# Patient Record
Sex: Male | Born: 1960 | Race: White | Hispanic: No | Marital: Single | Smoking: Never smoker
Health system: Southern US, Community
[De-identification: ages and names within clinical notes are randomized; demographics above are authoritative.]

## PROBLEM LIST (undated history)

## (undated) DIAGNOSIS — K5730 Diverticulosis of large intestine without perforation or abscess without bleeding: Secondary | ICD-10-CM

## (undated) DIAGNOSIS — M5030 Other cervical disc degeneration, unspecified cervical region: Secondary | ICD-10-CM

## (undated) DIAGNOSIS — L409 Psoriasis, unspecified: Secondary | ICD-10-CM

## (undated) DIAGNOSIS — G4733 Obstructive sleep apnea (adult) (pediatric): Secondary | ICD-10-CM

## (undated) DIAGNOSIS — F411 Generalized anxiety disorder: Secondary | ICD-10-CM

## (undated) DIAGNOSIS — N529 Male erectile dysfunction, unspecified: Secondary | ICD-10-CM

## (undated) DIAGNOSIS — E785 Hyperlipidemia, unspecified: Secondary | ICD-10-CM

## (undated) DIAGNOSIS — G20C Parkinsonism, unspecified: Secondary | ICD-10-CM

## (undated) DIAGNOSIS — Z8659 Personal history of other mental and behavioral disorders: Secondary | ICD-10-CM

## (undated) DIAGNOSIS — G3184 Mild cognitive impairment, so stated: Secondary | ICD-10-CM

## (undated) DIAGNOSIS — Z8719 Personal history of other diseases of the digestive system: Secondary | ICD-10-CM

## (undated) DIAGNOSIS — N3281 Overactive bladder: Secondary | ICD-10-CM

## (undated) DIAGNOSIS — G20 Parkinson's disease: Secondary | ICD-10-CM

## (undated) DIAGNOSIS — R2242 Localized swelling, mass and lump, left lower limb: Secondary | ICD-10-CM

## (undated) DIAGNOSIS — Z9989 Dependence on other enabling machines and devices: Secondary | ICD-10-CM

## (undated) HISTORY — DX: Dependence on other enabling machines and devices: Z99.89

## (undated) HISTORY — DX: Parkinson's disease: G20

## (undated) HISTORY — PX: APPENDECTOMY: SHX54

## (undated) HISTORY — DX: Hyperlipidemia, unspecified: E78.5

---
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
## 1997-09-21 ENCOUNTER — Emergency Department (HOSPITAL_COMMUNITY): Admission: EM | Admit: 1997-09-21 | Discharge: 1997-09-21 | Payer: Self-pay

## 1997-09-27 ENCOUNTER — Emergency Department (HOSPITAL_COMMUNITY): Admission: EM | Admit: 1997-09-27 | Discharge: 1997-09-27 | Payer: Self-pay | Admitting: Emergency Medicine

## 2000-02-17 ENCOUNTER — Emergency Department (HOSPITAL_COMMUNITY): Admission: EM | Admit: 2000-02-17 | Discharge: 2000-02-17 | Payer: Self-pay | Admitting: Emergency Medicine

## 2000-08-20 ENCOUNTER — Ambulatory Visit (HOSPITAL_COMMUNITY): Admission: RE | Admit: 2000-08-20 | Discharge: 2000-08-20 | Payer: Self-pay | Admitting: Family Medicine

## 2000-08-20 ENCOUNTER — Encounter: Payer: Self-pay | Admitting: Family Medicine

## 2008-11-14 ENCOUNTER — Emergency Department (HOSPITAL_COMMUNITY): Admission: EM | Admit: 2008-11-14 | Discharge: 2008-11-14 | Payer: Self-pay | Admitting: Emergency Medicine
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
## 2010-07-09 LAB — ETHANOL: Alcohol, Ethyl (B): 186 mg/dL — ABNORMAL HIGH (ref 0–10)

## 2013-08-06 ENCOUNTER — Ambulatory Visit (INDEPENDENT_AMBULATORY_CARE_PROVIDER_SITE_OTHER): Payer: BC Managed Care – PPO | Admitting: General Surgery

## 2013-08-06 ENCOUNTER — Encounter (INDEPENDENT_AMBULATORY_CARE_PROVIDER_SITE_OTHER): Payer: Self-pay | Admitting: General Surgery

## 2013-08-06 VITALS — BP 130/80 | HR 82 | Temp 97.8°F | Resp 16 | Ht 68.0 in | Wt 195.8 lb

## 2013-08-06 DIAGNOSIS — K429 Umbilical hernia without obstruction or gangrene: Secondary | ICD-10-CM

## 2013-08-06 NOTE — Progress Notes (Signed)
Patient ID: Hector AlbertsDaniel Douglas, male   DOB: 13-Jan-1961, 53 y.o.   MRN: [id number]  Chief Complaint  Patient presents with  . New Evaluation    eval umb hernia    HPI Hector Douglas is a 53 y.o. male.  He is referred by Dr. Wynelle LinkSun for evaluation of umbilical pain and possible hernia.  The patient has no prior history of any type of hernia. 2 weeks ago while he was helping up and move furniture he develops a little bit of discomfort and thought he felt a bulge at the upper rim of the umbilicus. He saw his PCP who also that there might be a small bulge. He is asymptomatic now  Comorbidities included sleep apnea, on CPAP, followed by Benjaman KindlerJim Osborne. Psoriasis. Hyperlipidemia he is employed as a [employer] and doesn't do any heavy lifting  HPI  Past Medical History  Diagnosis Date  . Hyperlipidemia     Past Surgical History  Procedure Laterality Date  . Appendectomy      1977    Family History  Problem Relation Age of Onset  . Cancer Mother 4550    breast    Social History History  Substance Use Topics  . Smoking status: Never Smoker   . Smokeless tobacco: Never Used  . Alcohol Use: Yes     Comment: occ    No Known Allergies  Current Outpatient Prescriptions  Medication Sig Dispense Refill  . aspirin 81 MG tablet Take 81 mg by mouth daily.      . cyclobenzaprine (FLEXERIL) 10 MG tablet Take 10 mg by mouth 3 (three) times daily as needed for muscle spasms.      . simvastatin (ZOCOR) 20 MG tablet Take 20 mg by mouth daily.      . traMADol (ULTRAM) 50 MG tablet Take 50 mg by mouth every 12 (twelve) hours as needed.       No current facility-administered medications for this visit.    Review of Systems Review of Systems  Constitutional: Positive for fatigue. Negative for fever, chills and unexpected weight change.  HENT: Negative for congestion, hearing loss, sore throat, trouble swallowing and voice change.   Eyes: Negative for visual disturbance.  Respiratory: Negative for cough and  wheezing.   Cardiovascular: Negative for chest pain, palpitations and leg swelling.  Gastrointestinal: Positive for abdominal pain. Negative for nausea, vomiting, diarrhea, constipation, blood in stool, abdominal distention, anal bleeding and rectal pain.  Genitourinary: Negative for hematuria and difficulty urinating.  Musculoskeletal: Negative for arthralgias.  Skin: Negative for rash and wound.  Neurological: Negative for seizures, syncope, weakness and headaches.  Hematological: Negative for adenopathy. Does not bruise/bleed easily.  Psychiatric/Behavioral: Negative for confusion.    Blood pressure 130/80, pulse 82, temperature 97.8 F (36.6 C), temperature source Temporal, resp. rate 16, height 5\' 8"  (1.727 m), weight 195 lb 12.8 oz (88.814 kg).  Physical Exam Physical Exam  Constitutional: He is oriented to person, place, and time. He appears well-developed and well-nourished. No distress.  Very pleasant. Cooperative. Looks slightly fatigued, possibly related to his chronic sleep apnea.  HENT:  Head: Normocephalic.  Nose: Nose normal.  Mouth/Throat: No oropharyngeal exudate.  Eyes: Conjunctivae and EOM are normal. Pupils are equal, round, and reactive to light. Right eye exhibits no discharge. Left eye exhibits no discharge. No scleral icterus.  Neck: Normal range of motion. Neck supple. No JVD present. No tracheal deviation present. No thyromegaly present.  Cardiovascular: Normal rate, regular rhythm, normal heart sounds and intact distal pulses.  No murmur heard. Pulmonary/Chest: Effort normal and breath sounds normal. No stridor. No respiratory distress. He has no wheezes. He has no rales. He exhibits no tenderness.  Abdominal: Soft. Bowel sounds are normal. He exhibits no distension and no mass. There is no tenderness. There is no rebound and no guarding.  I cannot detect an umbilical or inguinal hernia despite multiple maneuvers supine and standing. There is no tenderness or  mass.  Musculoskeletal: Normal range of motion. He exhibits no edema and no tenderness.  Lymphadenopathy:    He has no cervical adenopathy.  Neurological: He is alert and oriented to person, place, and time. He has normal reflexes. Coordination normal.  Skin: Skin is warm and dry. No rash noted. He is not diaphoretic. No erythema. No pallor.  Psychiatric: He has a normal mood and affect. His behavior is normal. Judgment and thought content normal.    Data Reviewed Office notes from Dr. Wynelle LinkSun  Assessment    Umbilical pain. History is suggestive of a small umbilical hernia which is now asymptomatic and nondetectable on physical exam.  No indication for surgical intervention, unless symptoms or a bulge recur. I told him this might happen     Plan    I discussed the differential diagnosis including muscle strain or small hernia. I discussed operative techniques for repair of hernia that became necessary in the future.  Resume normal activities  Return to see me if he develops recurrent pain or bulge.        [name] 08/06/2013, 9:35 AM

## 2013-08-06 NOTE — Patient Instructions (Signed)
It sounds like you may have a very small umbilical hernia, but this cannot be detected on physical exam today.  Since we cannot feel the hernia and you are not having the pain, I do not advise any surgery or x-rays at this time.  You may resume normal activities.  Return to see Dr. Derrell LollingIngram if the pain returns or an obvious bulge develops.       Umbilical Herniorrhaphy Herniorrhaphy is surgery to repair a hernia. A hernia is the protrusion of a part of an organ through an abdominal opening. An umbilical hernia means that your hernia is in the area around your navel. If the hernia is not repaired, the gap could get bigger. Your intestines or other tissues, such as fat, could get trapped in the gap. This can lead to other health problems, such as blocked intestines. If the hernia is fixed before problems set in, you may be allowed to go home the same day as the surgery (outpatient). LET YOUR CAREGIVER KNOW ABOUT:  Allergies to food or medicine.  Medicines taken, including vitamins, herbs, eyedrops, over-the-counter medicines, and creams.  Use of steroids (by mouth or creams).  Previous problems with anesthetics or numbing medicines.  History of bleeding problems or blood clots.  Previous surgery.  Other health problems, including diabetes and kidney problems.  Possibility of pregnancy, if this applies. RISKS AND COMPLICATIONS  Pain.  Excessive bleeding.  Hematoma. This is a pocket of blood that collects under the surgery site.  Infection at the surgery site.  Numbness at the surgery site.  Swelling and bruising.  Blood clots.  Intestinal damage (rare).  Scarring.  Skin damage.  Development of another hernia. This may require another surgery. BEFORE THE PROCEDURE  Ask your caregiver about changing or stopping your regular medicines. You may need to stop taking aspirin, nonsteroidal anti-inflammatory drugs (NSAIDs), vitamin E, and blood thinners as early as 2 weeks  before the procedure.  Do not eat or drink for 8 hours before the procedure, or as directed by your caregiver.  You might be asked to shower or wash with an antibacterial soap before the procedure.  Wear comfortable clothes that will be easy to put on after the procedure. PROCEDURE You will be given an intravenous (IV) tube. A needle will be inserted in your arm. Medicine will flow directly into your body through this needle. You might be given medicine to help you relax (sedative). You will be given medicine that numbs the area (local anesthetic) or medicine that makes you sleep (general anesthetic). If you have open surgery:  The surgeon will make a cut (incision) in your abdomen.  The gap in the muscle wall will be repaired. The surgeon may sew the edges together over the gap or use a mesh material to strengthen the area. When mesh is used, the body grows new, strong tissue into and around it. This new tissue closes the gap.  A drain might be put in to remove excess fluid from the body after surgery.  The surgeon will close the incision with stitches, glue, or staples. If you have laparoscopic surgery:  The surgeon will make several small incisions in your abdomen.  A thin, lighted tube (laparoscope) will be inserted into the abdomen through an incision. A camera is attached to the laparoscope that allows the surgeon to see inside the abdomen.  Tools will be inserted through the other incisions to repair the hernia. Usually, mesh is used to cover the gap.  The  surgeon will close the incisions with stitches. AFTER THE PROCEDURE  You will be taken to a recovery area. A nurse will watch and check your progress.  When you are awake, feeling well, and taking fluids well, you may be allowed to go home. In some cases, you may need to stay overnight in the hospital.  Arrange for someone to drive you home. Document Released: 06/15/2008 Document Revised: 09/18/2011 Document Reviewed:  06/20/2011 [facility] Patient Information 2014 [employer] [place].

## 2015-01-10 ENCOUNTER — Other Ambulatory Visit: Payer: Self-pay | Admitting: General Surgery

## 2016-04-02 HISTORY — PX: UMBILICAL HERNIA REPAIR: SHX196

## 2018-06-05 DIAGNOSIS — N400 Enlarged prostate without lower urinary tract symptoms: Secondary | ICD-10-CM | POA: Diagnosis not present

## 2018-06-05 DIAGNOSIS — N3281 Overactive bladder: Secondary | ICD-10-CM | POA: Diagnosis not present

## 2019-04-03 HISTORY — PX: CATARACT EXTRACTION W/ INTRAOCULAR LENS IMPLANT: SHX1309

## 2020-06-29 ENCOUNTER — Observation Stay (HOSPITAL_COMMUNITY)
Admission: EM | Admit: 2020-06-29 | Discharge: 2020-07-02 | Disposition: A | Discharge status: Home / Self Care | Payer: 59 | Attending: Internal Medicine | Admitting: Internal Medicine

## 2020-06-29 ENCOUNTER — Other Ambulatory Visit: Payer: Self-pay

## 2020-06-29 DIAGNOSIS — I959 Hypotension, unspecified: Secondary | ICD-10-CM | POA: Diagnosis present

## 2020-06-29 DIAGNOSIS — R55 Syncope and collapse: Secondary | ICD-10-CM

## 2020-06-29 DIAGNOSIS — Z803 Family history of malignant neoplasm of breast: Secondary | ICD-10-CM

## 2020-06-29 DIAGNOSIS — E785 Hyperlipidemia, unspecified: Secondary | ICD-10-CM | POA: Insufficient documentation

## 2020-06-29 DIAGNOSIS — J309 Allergic rhinitis, unspecified: Secondary | ICD-10-CM | POA: Insufficient documentation

## 2020-06-29 DIAGNOSIS — D62 Acute posthemorrhagic anemia: Principal | ICD-10-CM | POA: Insufficient documentation

## 2020-06-29 DIAGNOSIS — K5731 Diverticulosis of large intestine without perforation or abscess with bleeding: Secondary | ICD-10-CM | POA: Diagnosis not present

## 2020-06-29 DIAGNOSIS — N529 Male erectile dysfunction, unspecified: Secondary | ICD-10-CM | POA: Diagnosis present

## 2020-06-29 DIAGNOSIS — K921 Melena: Secondary | ICD-10-CM | POA: Diagnosis present

## 2020-06-29 DIAGNOSIS — G4733 Obstructive sleep apnea (adult) (pediatric): Secondary | ICD-10-CM | POA: Insufficient documentation

## 2020-06-29 DIAGNOSIS — Z7982 Long term (current) use of aspirin: Secondary | ICD-10-CM

## 2020-06-29 DIAGNOSIS — Z20822 Contact with and (suspected) exposure to covid-19: Secondary | ICD-10-CM | POA: Insufficient documentation

## 2020-06-29 DIAGNOSIS — K635 Polyp of colon: Secondary | ICD-10-CM | POA: Insufficient documentation

## 2020-06-29 DIAGNOSIS — R569 Unspecified convulsions: Secondary | ICD-10-CM | POA: Diagnosis present

## 2020-06-29 DIAGNOSIS — Z79899 Other long term (current) drug therapy: Secondary | ICD-10-CM | POA: Diagnosis not present

## 2020-06-29 DIAGNOSIS — E876 Hypokalemia: Secondary | ICD-10-CM | POA: Diagnosis not present

## 2020-06-29 DIAGNOSIS — D649 Anemia, unspecified: Secondary | ICD-10-CM

## 2020-06-29 DIAGNOSIS — D72829 Elevated white blood cell count, unspecified: Secondary | ICD-10-CM | POA: Insufficient documentation

## 2020-06-29 DIAGNOSIS — K621 Rectal polyp: Secondary | ICD-10-CM | POA: Diagnosis not present

## 2020-06-29 DIAGNOSIS — K922 Gastrointestinal hemorrhage, unspecified: Principal | ICD-10-CM

## 2020-06-29 LAB — CBC WITH DIFFERENTIAL/PLATELET
Abs Immature Granulocytes: 0.18 10*3/uL — ABNORMAL HIGH (ref 0.00–0.07)
Basophils Absolute: 0.1 10*3/uL (ref 0.0–0.1)
Basophils Relative: 0 %
Eosinophils Absolute: 0.1 10*3/uL (ref 0.0–0.5)
Eosinophils Relative: 1 %
HCT: 36.1 % — ABNORMAL LOW (ref 39.0–52.0)
Hemoglobin: 11.7 g/dL — ABNORMAL LOW (ref 13.0–17.0)
Immature Granulocytes: 1 %
Lymphocytes Relative: 8 %
Lymphs Abs: 1.6 10*3/uL (ref 0.7–4.0)
MCH: 31.1 pg (ref 26.0–34.0)
MCHC: 32.4 g/dL (ref 30.0–36.0)
MCV: 96 fL (ref 80.0–100.0)
Monocytes Absolute: 1.1 10*3/uL — ABNORMAL HIGH (ref 0.1–1.0)
Monocytes Relative: 6 %
Neutro Abs: 16 10*3/uL — ABNORMAL HIGH (ref 1.7–7.7)
Neutrophils Relative %: 84 %
Platelets: 256 10*3/uL (ref 150–400)
RBC: 3.76 MIL/uL — ABNORMAL LOW (ref 4.22–5.81)
RDW: 13 % (ref 11.5–15.5)
WBC: 19 10*3/uL — ABNORMAL HIGH (ref 4.0–10.5)
nRBC: 0 % (ref 0.0–0.2)

## 2020-06-29 LAB — PROTIME-INR
INR: 1 (ref 0.8–1.2)
Prothrombin Time: 13.1 seconds (ref 11.4–15.2)

## 2020-06-29 LAB — COMPREHENSIVE METABOLIC PANEL
ALT: 26 U/L (ref 0–44)
AST: 20 U/L (ref 15–41)
Albumin: 3.6 g/dL (ref 3.5–5.0)
Alkaline Phosphatase: 52 U/L (ref 38–126)
Anion gap: 7 (ref 5–15)
BUN: 16 mg/dL (ref 6–20)
CO2: 25 mmol/L (ref 22–32)
Calcium: 8.5 mg/dL — ABNORMAL LOW (ref 8.9–10.3)
Chloride: 105 mmol/L (ref 98–111)
Creatinine, Ser: 1.07 mg/dL (ref 0.61–1.24)
GFR, Estimated: >60 mL/min (ref >60)
Glucose, Bld: 139 mg/dL — ABNORMAL HIGH (ref 70–99)
Potassium: 3.7 mmol/L (ref 3.5–5.1)
Sodium: 137 mmol/L (ref 135–145)
Total Bilirubin: 0.6 mg/dL (ref 0.3–1.2)
Total Protein: 5.8 g/dL — ABNORMAL LOW (ref 6.5–8.1)

## 2020-06-29 LAB — I-STAT CHEM 8, ED
BUN: 18 mg/dL (ref 6–20)
Calcium, Ion: 1.16 mmol/L (ref 1.15–1.40)
Chloride: 102 mmol/L (ref 98–111)
Creatinine, Ser: 1 mg/dL (ref 0.61–1.24)
Glucose, Bld: 136 mg/dL — ABNORMAL HIGH (ref 70–99)
HCT: 34 % — ABNORMAL LOW (ref 39.0–52.0)
Hemoglobin: 11.6 g/dL — ABNORMAL LOW (ref 13.0–17.0)
Potassium: 3.6 mmol/L (ref 3.5–5.1)
Sodium: 139 mmol/L (ref 135–145)
TCO2: 25 mmol/L (ref 22–32)

## 2020-06-29 LAB — TYPE AND SCREEN
ABO/RH(D): A POS
Antibody Screen: NEGATIVE

## 2020-06-29 LAB — POC OCCULT BLOOD, ED: Fecal Occult Bld: POSITIVE — AB

## 2020-06-29 MED ORDER — SODIUM CHLORIDE 0.9 % IV SOLN: INTRAVENOUS | Status: DC

## 2020-06-29 MED ORDER — SODIUM CHLORIDE 0.9 % IV BOLUS
1000.0000 mL | Freq: Once | INTRAVENOUS | Status: AC
  Administered 2020-06-29: 1000 mL via INTRAVENOUS

## 2020-06-29 MED ORDER — PANTOPRAZOLE SODIUM 40 MG IV SOLR
8.0000 mg/h | INTRAVENOUS | Status: DC
  Administered 2020-06-29 – 2020-07-02 (×6): 8 mg/h via INTRAVENOUS
  Filled 2020-06-29 (×10): qty 80

## 2020-06-29 MED ORDER — PANTOPRAZOLE SODIUM 40 MG IV SOLR
80.0000 mg | Freq: Once | INTRAVENOUS | Status: AC
  Administered 2020-06-29: 80 mg via INTRAVENOUS
  Filled 2020-06-29: qty 80
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
## 2020-06-30 DIAGNOSIS — Z7982 Long term (current) use of aspirin: Secondary | ICD-10-CM | POA: Diagnosis not present

## 2020-06-30 DIAGNOSIS — I959 Hypotension, unspecified: Secondary | ICD-10-CM | POA: Diagnosis present

## 2020-06-30 DIAGNOSIS — E785 Hyperlipidemia, unspecified: Secondary | ICD-10-CM | POA: Diagnosis not present

## 2020-06-30 DIAGNOSIS — K5731 Diverticulosis of large intestine without perforation or abscess with bleeding: Secondary | ICD-10-CM | POA: Diagnosis present

## 2020-06-30 DIAGNOSIS — N529 Male erectile dysfunction, unspecified: Secondary | ICD-10-CM | POA: Diagnosis present

## 2020-06-30 DIAGNOSIS — R55 Syncope and collapse: Secondary | ICD-10-CM

## 2020-06-30 DIAGNOSIS — K621 Rectal polyp: Secondary | ICD-10-CM | POA: Diagnosis present

## 2020-06-30 DIAGNOSIS — D72829 Elevated white blood cell count, unspecified: Secondary | ICD-10-CM | POA: Diagnosis present

## 2020-06-30 DIAGNOSIS — E876 Hypokalemia: Secondary | ICD-10-CM | POA: Diagnosis present

## 2020-06-30 DIAGNOSIS — R569 Unspecified convulsions: Secondary | ICD-10-CM | POA: Diagnosis present

## 2020-06-30 DIAGNOSIS — D62 Acute posthemorrhagic anemia: Secondary | ICD-10-CM | POA: Diagnosis present

## 2020-06-30 DIAGNOSIS — J309 Allergic rhinitis, unspecified: Secondary | ICD-10-CM | POA: Diagnosis present

## 2020-06-30 DIAGNOSIS — K922 Gastrointestinal hemorrhage, unspecified: Secondary | ICD-10-CM | POA: Diagnosis not present

## 2020-06-30 DIAGNOSIS — G4733 Obstructive sleep apnea (adult) (pediatric): Secondary | ICD-10-CM | POA: Diagnosis present

## 2020-06-30 DIAGNOSIS — Z803 Family history of malignant neoplasm of breast: Secondary | ICD-10-CM | POA: Diagnosis not present

## 2020-06-30 DIAGNOSIS — Z20822 Contact with and (suspected) exposure to covid-19: Secondary | ICD-10-CM | POA: Diagnosis present

## 2020-06-30 DIAGNOSIS — K5791 Diverticulosis of intestine, part unspecified, without perforation or abscess with bleeding: Secondary | ICD-10-CM | POA: Diagnosis not present

## 2020-06-30 DIAGNOSIS — D649 Anemia, unspecified: Secondary | ICD-10-CM | POA: Diagnosis not present

## 2020-06-30 DIAGNOSIS — Z79899 Other long term (current) drug therapy: Secondary | ICD-10-CM | POA: Diagnosis not present

## 2020-06-30 DIAGNOSIS — K635 Polyp of colon: Secondary | ICD-10-CM | POA: Diagnosis present

## 2020-06-30 LAB — BASIC METABOLIC PANEL
Anion gap: 5 (ref 5–15)
BUN: 15 mg/dL (ref 6–20)
CO2: 24 mmol/L (ref 22–32)
Calcium: 8.2 mg/dL — ABNORMAL LOW (ref 8.9–10.3)
Chloride: 107 mmol/L (ref 98–111)
Creatinine, Ser: 0.93 mg/dL (ref 0.61–1.24)
GFR, Estimated: >60 mL/min (ref >60)
Glucose, Bld: 148 mg/dL — ABNORMAL HIGH (ref 70–99)
Potassium: 4 mmol/L (ref 3.5–5.1)
Sodium: 136 mmol/L (ref 135–145)

## 2020-06-30 LAB — CBC
HCT: 31.7 % — ABNORMAL LOW (ref 39.0–52.0)
Hemoglobin: 10.2 g/dL — ABNORMAL LOW (ref 13.0–17.0)
MCH: 30.8 pg (ref 26.0–34.0)
MCHC: 32.2 g/dL (ref 30.0–36.0)
MCV: 95.8 fL (ref 80.0–100.0)
Platelets: 226 10*3/uL (ref 150–400)
RBC: 3.31 MIL/uL — ABNORMAL LOW (ref 4.22–5.81)
RDW: 13.2 % (ref 11.5–15.5)
WBC: 11.1 10*3/uL — ABNORMAL HIGH (ref 4.0–10.5)
nRBC: 0 % (ref 0.0–0.2)

## 2020-06-30 LAB — HIV ANTIBODY (ROUTINE TESTING W REFLEX): HIV Screen 4th Generation wRfx: NONREACTIVE

## 2020-06-30 LAB — RESP PANEL BY RT-PCR (FLU A&B, COVID) ARPGX2
Influenza A by PCR: NEGATIVE
Influenza B by PCR: NEGATIVE
SARS Coronavirus 2 by RT PCR: NEGATIVE

## 2020-06-30 LAB — ABO/RH: ABO/RH(D): A POS

## 2020-06-30 LAB — HEMOGLOBIN AND HEMATOCRIT, BLOOD
HCT: 30.8 % — ABNORMAL LOW (ref 39.0–52.0)
Hemoglobin: 9.8 g/dL — ABNORMAL LOW (ref 13.0–17.0)

## 2020-06-30 MED ORDER — ATORVASTATIN CALCIUM 40 MG PO TABS
40.0000 mg | ORAL_TABLET | Freq: Every day | ORAL | Status: DC
  Administered 2020-06-30 – 2020-07-02 (×3): 40 mg via ORAL
  Filled 2020-06-30 (×3): qty 1

## 2020-06-30 MED ORDER — OCTREOTIDE LOAD VIA INFUSION
50.0000 ug | Freq: Once | INTRAVENOUS | Status: DC
  Filled 2020-06-30: qty 25

## 2020-06-30 MED ORDER — SODIUM CHLORIDE 0.9 % IV SOLN
50.0000 ug/h | INTRAVENOUS | Status: DC
  Filled 2020-06-30: qty 1

## 2020-06-30 MED ORDER — FESOTERODINE FUMARATE ER 8 MG PO TB24
8.0000 mg | ORAL_TABLET | Freq: Every day | ORAL | Status: DC
  Administered 2020-06-30 – 2020-07-02 (×3): 8 mg via ORAL
  Filled 2020-06-30 (×3): qty 1

## 2020-06-30 MED ORDER — ADULT MULTIVITAMIN W/MINERALS CH
1.0000 | ORAL_TABLET | Freq: Every day | ORAL | Status: DC
  Administered 2020-06-30 – 2020-07-02 (×3): 1 via ORAL
  Filled 2020-06-30 (×3): qty 1

## 2020-06-30 MED ORDER — LACTATED RINGERS IV BOLUS
1000.0000 mL | Freq: Once | INTRAVENOUS | Status: AC
  Administered 2020-06-30: 1000 mL via INTRAVENOUS

## 2020-06-30 MED ORDER — LORATADINE 10 MG PO TABS
10.0000 mg | ORAL_TABLET | Freq: Every day | ORAL | Status: DC
  Administered 2020-06-30 – 2020-07-02 (×3): 10 mg via ORAL
  Filled 2020-06-30 (×3): qty 1

## 2020-06-30 MED ORDER — ONDANSETRON HCL 4 MG PO TABS: 4.0000 mg | ORAL_TABLET | Freq: Four times a day (QID) | ORAL | Status: DC | PRN

## 2020-06-30 MED ORDER — ONDANSETRON HCL 4 MG/2ML IJ SOLN: 4.0000 mg | Freq: Four times a day (QID) | INTRAMUSCULAR | Status: DC | PRN

## 2020-06-30 MED ORDER — MONTELUKAST SODIUM 10 MG PO TABS
10.0000 mg | ORAL_TABLET | Freq: Every day | ORAL | Status: DC
Start: 2020-06-30 — End: 2020-07-02
  Administered 2020-06-30 – 2020-07-01 (×3): 10 mg via ORAL
  Filled 2020-06-30 (×4): qty 1

## 2020-06-30 MED ORDER — PEG 3350-KCL-NA BICARB-NACL 420 G PO SOLR
4000.0000 mL | Freq: Once | ORAL | Status: AC
  Administered 2020-06-30: 4000 mL via ORAL
  Filled 2020-06-30: qty 4000

## 2020-06-30 MED ORDER — SODIUM CHLORIDE 0.9 % IV BOLUS
1000.0000 mL | Freq: Once | INTRAVENOUS | Status: AC
  Administered 2020-06-30: 1000 mL via INTRAVENOUS

## 2020-06-30 MED ORDER — CYCLOBENZAPRINE HCL 10 MG PO TABS: 10.0000 mg | ORAL_TABLET | Freq: Three times a day (TID) | ORAL | Status: DC | PRN

## 2020-06-30 MED ORDER — ACETAMINOPHEN 650 MG RE SUPP: 650.0000 mg | Freq: Four times a day (QID) | RECTAL | Status: DC | PRN

## 2020-06-30 MED ORDER — SODIUM CHLORIDE 0.9 % IV SOLN: 10.0000 mL/h | Freq: Once | INTRAVENOUS | Status: DC

## 2020-06-30 MED ORDER — ACETAMINOPHEN 325 MG PO TABS: 650.0000 mg | ORAL_TABLET | Freq: Four times a day (QID) | ORAL | Status: DC | PRN

## 2020-06-30 NOTE — Consult Note (Signed)
UNASSIGNED CONSULT  Reason for Consult: Hematochezia and syncope Referring Physician: [facility]  [name] HPI: This is a 60 year old male with a PMH of hyperlipidemia admitted for hematochezia and syncope.  He started to notice some streaking of blood on Sunday, but then on Monday the bleeding accelerated.  He stated that it was difficult for him to discern to stool from the blood.  The patient contacted his PCP and the plan was to have him see his primary GI at [facility].  The patient had a routine colonoscopy in 2017 at [facility] for a personal history of a polyp.  This last colonoscopy, per his report, was normal and he was instructed to have a colonoscopy in 10 years.  Prior to that time, at the age of 49 he had a routine screening colonoscopy with Dr. Peters with findings of a polyp.  The patient does not recall being diagnosed with diverticula.  He denies any issues with abdominal pain, hematemesis, or using any NSAIDs.  On the evening of admission he had more bleeding and this was followed by syncope.  As a result of his symptoms he presented to the ER.  Past Medical History:  Diagnosis Date  . Hyperlipidemia     Past Surgical History:  Procedure Laterality Date  . APPENDECTOMY     19 76    Family History  Problem Relation Age of Onset  . Cancer Mother 55       breast    Social History:  reports that he has never smoked. He has never used smokeless tobacco. He reports current alcohol use. He reports that he does not use drugs.  Allergies: No Known Allergies  Medications:  Scheduled: . atorvastatin  40 mg Oral Daily  . fesoterodine  8 mg Oral Daily  . loratadine  10 mg Oral Daily  . montelukast  10 mg Oral QHS  . multivitamin with minerals  1 tablet Oral Daily  . polyethylene glycol-electrolytes  4,000 mL Oral Once   Continuous: . sodium chloride 125 mL/hr at 06/30/20 0340  . sodium chloride Stopped (06/30/20 0209)  . pantoprozole (PROTONIX) infusion 8  mg/hr (06/30/20 0915)    Results for orders placed or performed during the hospital encounter of 06/29/20 (from the past 24 hour(s))  CBC with Differential     Status: Abnormal   Collection Time: 06/29/20 10:50 PM  Result Value Ref Range   WBC 19.0 (H) 4.0 - 10.5 K/uL   RBC 3.76 (L) 4.22 - 5.81 MIL/uL   Hemoglobin 11.7 (L) 13.0 - 17.0 g/dL   HCT 07/01/20 (L) 54.0 - 08.6 %   MCV 96.0 80.0 - 100.0 fL   MCH 31.1 26.0 - 34.0 pg   MCHC 32.4 30.0 - 36.0 g/dL   RDW 76.1 95.0 - 93.2 %   Platelets 256 150 - 400 K/uL   nRBC 0.0 0.0 - 0.2 %   Neutrophils Relative % 84 %   Neutro Abs 16.0 (H) 1.7 - 7.7 K/uL   Lymphocytes Relative 8 %   Lymphs Abs 1.6 0.7 - 4.0 K/uL   Monocytes Relative 6 %   Monocytes Absolute 1.1 (H) 0.1 - 1.0 K/uL   Eosinophils Relative 1 %   Eosinophils Absolute 0.1 0.0 - 0.5 K/uL   Basophils Relative 0 %   Basophils Absolute 0.1 0.0 - 0.1 K/uL   Immature Granulocytes 1 %   Abs Immature Granulocytes 0.18 (H) 0.00 - 0.07 K/uL  Comprehensive metabolic panel  Status: Abnormal   Collection Time: 06/29/20 10:50 PM  Result Value Ref Range   Sodium 137 135 - 145 mmol/L   Potassium 3.7 3.5 - 5.1 mmol/L   Chloride 105 98 - 111 mmol/L   CO2 25 22 - 32 mmol/L   Glucose, Bld 139 (H) 70 - 99 mg/dL   BUN 16 6 - 20 mg/dL   Creatinine, Ser 2.54 0.61 - 1.24 mg/dL   Calcium 8.5 (L) 8.9 - 10.3 mg/dL   Total Protein 5.8 (L) 6.5 - 8.1 g/dL   Albumin 3.6 3.5 - 5.0 g/dL   AST 20 15 - 41 U/L   ALT 26 0 - 44 U/L   Alkaline Phosphatase 52 38 - 126 U/L   Total Bilirubin 0.6 0.3 - 1.2 mg/dL   GFR, Estimated >98 >26 mL/min   Anion gap 7 5 - 15  Protime-INR     Status: None   Collection Time: 06/29/20 10:50 PM  Result Value Ref Range   Prothrombin Time 13.1 11.4 - 15.2 seconds   INR 1.0 0.8 - 1.2  Type and screen [facility]     Status: None   Collection Time: 06/29/20 10:50 PM  Result Value Ref Range   ABO/RH(D) A POS    Antibody Screen NEG    Sample Expiration       07/02/2020,2359 Performed at [facility], [address], [place], [place] [address]   I-stat chem 8, ED (not at [facility] or [facility])     Status: Abnormal   Collection Time: 06/29/20 10:52 PM  Result Value Ref Range   Sodium 139 135 - 145 mmol/L   Potassium 3.6 3.5 - 5.1 mmol/L   Chloride 102 98 - 111 mmol/L   BUN 18 6 - 20 mg/dL   Creatinine, Ser 0.94 0.61 - 1.24 mg/dL   Glucose, Bld 076 (H) 70 - 99 mg/dL   Calcium, Ion 8.08 8.11 - 1.40 mmol/L   TCO2 25 22 - 32 mmol/L   Hemoglobin 11.6 (L) 13.0 - 17.0 g/dL   HCT 03.1 (L) 59.4 - 58.5 %  POC occult blood, ED     Status: Abnormal   Collection Time: 06/29/20 10:57 PM  Result Value Ref Range   Fecal Occult Bld POSITIVE (A) NEGATIVE  Resp Panel by RT-PCR (Flu A&B, Covid) Nasopharyngeal Swab     Status: None   Collection Time: 06/29/20 11:41 PM   Specimen: Nasopharyngeal Swab; Nasopharyngeal(NP) swabs in vial transport medium  Result Value Ref Range   SARS Coronavirus 2 by RT PCR NEGATIVE NEGATIVE   Influenza A by PCR NEGATIVE NEGATIVE   Influenza B by PCR NEGATIVE NEGATIVE  ABO/Rh     Status: None   Collection Time: 06/30/20 12:36 AM  Result Value Ref Range   ABO/RH(D)      A POS Performed at [facility], [address], [place], [place] [address]   CBC     Status: Abnormal   Collection Time: 06/30/20  1:12 AM  Result Value Ref Range   WBC 11.1 (H) 4.0 - 10.5 K/uL   RBC 3.31 (L) 4.22 - 5.81 MIL/uL   Hemoglobin 10.2 (L) 13.0 - 17.0 g/dL   HCT 46.2 (L) 86.3 - 81.7 %   MCV 95.8 80.0 - 100.0 fL   MCH 30.8 26.0 - 34.0 pg   MCHC 32.2 30.0 - 36.0 g/dL   RDW 71.1 65.7 - 90.3 %   Platelets 226 150 - 400 K/uL  nRBC 0.0 0.0 - 0.2 %  Basic metabolic panel     Status: Abnormal   Collection Time: 06/30/20  1:12 AM  Result Value Ref Range   Sodium 136 135 - 145 mmol/L   Potassium 4.0 3.5 - 5.1 mmol/L   Chloride 107 98 - 111 mmol/L   CO2 24 22 - 32 mmol/L   Glucose, Bld 148 (H) 70 - 99 mg/dL   BUN 15 6 - 20 mg/dL    Creatinine, Ser 6.75 0.61 - 1.24 mg/dL   Calcium 8.2 (L) 8.9 - 10.3 mg/dL   GFR, Estimated >91 >63 mL/min   Anion gap 5 5 - 15  Hemoglobin and hematocrit, blood     Status: Abnormal   Collection Time: 06/30/20  4:57 AM  Result Value Ref Range   Hemoglobin 9.8 (L) 13.0 - 17.0 g/dL   HCT 84.6 (L) 65.9 - 93.5 %     No results found.  ROS:  As stated above in the HPI otherwise negative.  Blood pressure 114/63, pulse 82, temperature 97.6 F (36.4 C), temperature source Oral, resp. rate (!) 22, SpO2 98 %.    PE: Gen: NAD, Alert and Oriented HEENT:  [place]/AT, EOMI Neck: Supple, no LAD Lungs: CTA Bilaterally CV: RRR without M/G/R ABD: Soft, NTND, +BS Ext: No C/C/E  Assessment/Plan: 1) Hematochezia. 2) Anemia. 3) Syncope.   The patient is currently stable, but he had three episodes of syncope in total.  His last hematochezia event was at 2 AM.  He is currently hemodynamically stable.  Plan: 1) EGD/colonoscopy tomorrow. 2) If the patient has hematochezia and becomes unstable, a CT angio is required.  Herman Mell D 06/30/2020, 9:40 AM

## 2020-06-30 NOTE — ED Provider Notes (Signed)
[facility]ENCY [facility] Provider Note   CSN: [id number] Arrival date & time: 06/29/20  2220     History Chief Complaint  Patient presents with  . Rectal Bleeding  . Loss of Consciousness    Cleta AlbertsDaniel Siegrist is a 60 y.o. male with a history of OSA on CPAP, hyperlipidemia, allergic rhinitis who presents to the emergency department by EMS with a chief complaint of loss of consciousness.  EMS reports they were called out for concern for loss of consciousness, seizure-like episodes, that each lasted an estimated 4 minutes.  Friends noted that the patient became unresponsive, eyes rolled back in his head, and he had twitching in his hands.  No full body jerking or shaking.  No incontinence or tongue biting.  EMS witnessed an episode that lasted approximately 20 seconds in route where the patient became unresponsive and his eyes rolled back in his head.  He was noted to be clammy, cool to the touch with labored breathing.  Hypotensive at 88/60.  He was not postictal after the episode.  In the ER, the patient reports that he went to dinner.  He had 1 beer with dinner.  He did endorse marijuana use earlier tonight, but states that he has smoked marijuana previously with no adverse reactions.  He reports that he stood up and became very lightheaded prior to the first witnessed episode of loss of consciousness.  His sister who is at bedside reports that subsequent episodes occurred after the patient attempted to climb a flight of stairs. No falls.  He did not hit his head.  He denies chest pain, shortness of breath, back pain, nausea, vomiting, hematemesis, hematuria, dysuria, constipation, dizziness, visual changes, numbness, weakness, fatigue.  The patient was seen by his PCP yesterday for rectal bleeding.  He reports 4 days of dark black diarrhea with some bright red blood noted on the toilet.  Hemoglobin was 14.1 yesterday.   [facility].  Unremarkable colonoscopy.  No family history of colorectal cancer.  No history of diverticulitis or peptic ulcer disease.  He does report that he will drink beer approximately 3-4 times a week, but denies any history of liver disease or cirrhosis.  He takes a baby aspirin, but otherwise denies NSAID use.  No recent travel.  No recent camping.  No recent trips to the beach or concern for undercooked seafood..  The history is provided by the patient and medical records. No language interpreter was used.       Past Medical History:  Diagnosis Date  . Hyperlipidemia     Patient Active Problem List   Diagnosis Date Noted  . GI bleed 06/30/2020  . ABLA (acute blood loss anemia) 06/30/2020  . HLD (hyperlipidemia) 06/30/2020  . Umbilical hernia 08/06/2013    Past Surgical History:  Procedure Laterality Date  . APPENDECTOMY     1977       Family History  Problem Relation Age of Onset  . Cancer Mother 6050       breast    Social History   Tobacco Use  . Smoking status: Never Smoker  . Smokeless tobacco: Never Used  Substance Use Topics  . Alcohol use: Yes    Comment: occ  . Drug use: No    Home Medications Prior to Admission medications   Medication Sig Start Date End Date Taking? Authorizing Provider  aspirin 81 MG tablet Take 81 mg by mouth daily.  Yes [provider]  cyclobenzaprine (FLEXERIL) 10 MG tablet Take 10 mg by mouth 3 (three) times daily as needed for muscle spasms.   Yes [provider]  loratadine (CLARITIN) 10 MG tablet Take 10 mg by mouth daily.   Yes [provider]  montelukast (SINGULAIR) 10 MG tablet Take 10 mg by mouth at bedtime. 05/16/20  Yes [provider]  Multiple Vitamins-Minerals (CENTRUM SILVER 50+MEN) TABS Take 1 tablet by mouth daily.   Yes [provider]  simvastatin (ZOCOR) 80 MG tablet Take 80 mg by mouth daily. 06/18/20  Yes [provider]  tolterodine  (DETROL LA) 4 MG 24 hr capsule Take 4 mg by mouth daily. 06/18/20  Yes [provider]  sildenafil (VIAGRA) 100 MG tablet Take 100 mg by mouth daily as needed for erectile dysfunction.    [provider]    Allergies    Patient has no known allergies.  Review of Systems   Review of Systems  Constitutional: Negative for appetite change, chills, fatigue and fever.  HENT: Negative for congestion and sore throat.   Respiratory: Negative for shortness of breath and wheezing.   Cardiovascular: Positive for syncope. Negative for chest pain and palpitations.  Gastrointestinal: Positive for blood in stool, diarrhea and hematochezia. Negative for abdominal pain, constipation, nausea, rectal pain and vomiting.  Genitourinary: Negative for dysuria.  Musculoskeletal: Negative for back pain, myalgias, neck pain and neck stiffness.  Skin: Negative for rash and wound.  Allergic/Immunologic: Negative for immunocompromised state.  Neurological: Negative for dizziness, seizures, syncope, weakness, numbness and headaches.  Psychiatric/Behavioral: Negative for confusion.    Physical Exam Updated Vital Signs BP 100/73   Pulse 72   Temp 97.6 F (36.4 C) (Oral)   Resp (!) 27   SpO2 98%   Physical Exam Vitals and nursing note reviewed.  Constitutional:      General: He is not in acute distress.    Appearance: He is well-developed. He is not ill-appearing, toxic-appearing or diaphoretic.  HENT:     Head: Normocephalic.  Eyes:     Conjunctiva/sclera: Conjunctivae normal.  Cardiovascular:     Rate and Rhythm: Normal rate and regular rhythm.     Pulses: Normal pulses.     Heart sounds: Normal heart sounds. No murmur heard. No friction rub. No gallop.   Pulmonary:     Effort: Pulmonary effort is normal. No respiratory distress.     Breath sounds: Normal breath sounds. No stridor. No wheezing, rhonchi or rales.  Chest:     Chest wall: No tenderness.  Abdominal:     General: There  is no distension.     Palpations: Abdomen is soft. There is no mass.     Tenderness: There is no abdominal tenderness. There is no right CVA tenderness, left CVA tenderness, guarding or rebound.     Hernia: No hernia is present.     Comments: Abdomen is distended, but soft and nontender  Musculoskeletal:     Cervical back: Neck supple.     Right lower leg: No edema.     Left lower leg: No edema.  Skin:    General: Skin is warm and dry.     Capillary Refill: Capillary refill takes less than 2 seconds.     Coloration: Skin is pale. Skin is not jaundiced.     Findings: No bruising, erythema or lesion.  Neurological:     Mental Status: He is alert.  Psychiatric:  Behavior: Behavior normal.     ED Results / Procedures / Treatments   Labs (all labs ordered are listed, but only abnormal results are displayed) Labs Reviewed  CBC WITH DIFFERENTIAL/PLATELET - Abnormal; Notable for the following components:      Result Value   WBC 19.0 (*)    RBC 3.76 (*)    Hemoglobin 11.7 (*)    HCT 36.1 (*)    Neutro Abs 16.0 (*)    Monocytes Absolute 1.1 (*)    Abs Immature Granulocytes 0.18 (*)    All other components within normal limits  COMPREHENSIVE METABOLIC PANEL - Abnormal; Notable for the following components:   Glucose, Bld 139 (*)    Calcium 8.5 (*)    Total Protein 5.8 (*)    All other components within normal limits  CBC - Abnormal; Notable for the following components:   WBC 11.1 (*)    RBC 3.31 (*)    Hemoglobin 10.2 (*)    HCT 31.7 (*)    All other components within normal limits  BASIC METABOLIC PANEL - Abnormal; Notable for the following components:   Glucose, Bld 148 (*)    Calcium 8.2 (*)    All other components within normal limits  I-STAT CHEM 8, ED - Abnormal; Notable for the following components:   Glucose, Bld 136 (*)    Hemoglobin 11.6 (*)    HCT 34.0 (*)    All other components within normal limits  POC OCCULT BLOOD, ED - Abnormal; Notable for the  following components:   Fecal Occult Bld POSITIVE (*)    All other components within normal limits  RESP PANEL BY RT-PCR (FLU A&B, COVID) ARPGX2  PROTIME-INR  HIV ANTIBODY (ROUTINE TESTING W REFLEX)  TYPE AND SCREEN  ABO/RH    EKG None  Radiology No results found.  Procedures .Critical Care Performed by: Barkley Boards, PA-C Authorized by: Barkley Boards, PA-C   Critical care provider statement:    Critical care time (minutes):  45   Critical care time was exclusive of:  Separately billable procedures and treating other patients and teaching time   Critical care was necessary to treat or prevent imminent or life-threatening deterioration of the following conditions: GI bleed.   Critical care was time spent personally by me on the following activities:  Ordering and performing treatments and interventions, ordering and review of laboratory studies, ordering and review of radiographic studies, pulse oximetry, re-evaluation of patient's condition, review of old charts, obtaining history from patient or surrogate, examination of patient, evaluation of patient's response to treatment and development of treatment plan with patient or surrogate   I assumed direction of critical care for this patient from another provider in my specialty: no     Care discussed with: admitting provider       Medications Ordered in ED Medications  pantoprazole (PROTONIX) 80 mg in sodium chloride 0.9 % 100 mL (0.8 mg/mL) infusion (8 mg/hr Intravenous New Bag/Given 06/29/20 2335)  sodium chloride 0.9 % bolus 1,000 mL (0 mLs Intravenous Stopped 06/30/20 0032)    And  0.9 %  sodium chloride infusion (0 mLs Intravenous Hold 06/29/20 2331)  0.9 %  sodium chloride infusion (0 mL/hr Intravenous Hold 06/30/20 0209)  atorvastatin (LIPITOR) tablet 40 mg (has no administration in time range)  fesoterodine (TOVIAZ) tablet 8 mg (has no administration in time range)  multivitamin with minerals tablet 1 tablet (has no  administration in time range)  montelukast (SINGULAIR) tablet 10 mg (  10 mg Oral Given 06/30/20 0229)  loratadine (CLARITIN) tablet 10 mg (has no administration in time range)  cyclobenzaprine (FLEXERIL) tablet 10 mg (has no administration in time range)  acetaminophen (TYLENOL) tablet 650 mg (has no administration in time range)    Or  acetaminophen (TYLENOL) suppository 650 mg (has no administration in time range)  ondansetron (ZOFRAN) tablet 4 mg (has no administration in time range)    Or  ondansetron (ZOFRAN) injection 4 mg (has no administration in time range)  pantoprazole (PROTONIX) 80 mg in sodium chloride 0.9 % 100 mL IVPB (0 mg Intravenous Stopped 06/30/20 0007)  sodium chloride 0.9 % bolus 1,000 mL (1,000 mLs Intravenous New Bag/Given 06/30/20 0148)    ED Course  I have reviewed the triage vital signs and the nursing notes.  Pertinent labs & imaging results that were available during my care of the patient were reviewed by me and considered in my medical decision making (see chart for details).    MDM Rules/Calculators/A&P                          60 year old male with a history of OSA on CPAP, hyperlipidemia, allergic rhinitis brought in by EMS for loss of consciousness x4.  There was concern for seizure-like activity.  Patient had new tongue biting or incontinence.  Patient has a history of seizures.  He has had rectal bleeding for the last 4 days.  Symptoms sound more consistent with syncope.  BP initially found to be 88/60 in route with EMS.  BP 90/62 on arrival to the ER.  No tachycardia. The patient was discussed with Dr. Clayborne Dana, attending physician.   Labs have been reviewed and independently interpreted by me.  Hemoglobin 11.7, down from 14 at his PCPs office yesterday.  He is Hemoccult positive.  He does have a leukocytosis of 19 of unknown significance.  Did consider hemorrhagic colitis, but less likely given that he does not have any abdominal tenderness.  He has no  other infectious symptoms. Patient was started on a bolus and infusion of Protonix.  Patient did endorse drinking 1 beer earlier tonight and does state that he drinks approximately 3 to 4 days a week.  He denies history of known liver disease or cirrhosis.  Did consider initially starting the patient on octreotide given his alcohol use and per chart review did have some previous elevated transaminases (which are normal today).  Hospitalist prefers to defer octreotide at this time.  Patient has been aggressively treated with IV fluids.  He has remained hemodynamically stable.  No further syncopal episodes in the ED.  Have a very low suspicion for seizures prior to arrival.  Did place consult call to gastroenterology.  No callback at this time, but secure message has been sent in epic chat.   Unsure of etiology at this time, but patient does report both melena and hematochezia.  Could consider diverticular bleed, patient does not have a history of diverticulitis.  He does not regularly take NSAIDs.  Less likely variceal bleed since he is not having vomiting.  However, patient is critically ill and will require admission.  Consult to the hospitalist team and Dr. Julian Reil will accept the patient for admission.  The patient appears reasonably stabilized for admission considering the current resources, flow, and capabilities available in the ED at this time, and I doubt any other [facility] requiring further screening and/or treatment in the ED prior to admission.   Final  Clinical Impression(s) / ED Diagnoses Final diagnoses:  Gastrointestinal hemorrhage, unspecified gastrointestinal hemorrhage type  Symptomatic anemia  Syncope and collapse    Rx / DC Orders ED Discharge Orders    None       Barkley Boards, PA-C 06/30/20 0334    Mesner, Barbara Cower, MD 06/30/20 [phone]

## 2020-06-30 NOTE — Progress Notes (Signed)
[facility] night shift.  The nursing staff reports that the patient has been running low BP numbers with his systolic most recently in the 33A.  He has not been tachycardic and his HR has been ranging between 68 to 81 bpm.  His SBP earlier has been ranging from 90 to 105 mmHg.  He received 2000 mL of normal saline bolus earlier.  A 1000 mL LR bolus and a follow-up hematocrit and hemoglobin have been ordered.  Will transfuse if needed.  Sanda Klein, MD.

## 2020-06-30 NOTE — H&P (View-Only) (Signed)
UNASSIGNED CONSULT  Reason for Consult: Hematochezia and syncope Referring Physician: [facility]  Hector Douglas HPI: This is a 60 year old male with a PMH of hyperlipidemia admitted for hematochezia and syncope.  He started to notice some streaking of blood on Sunday, but then on Monday the bleeding accelerated.  He stated that it was difficult for him to discern to stool from the blood.  The patient contacted his PCP and the plan was to have him see his primary GI at [facility].  The patient had a routine colonoscopy in 2017 at [facility] for a personal history of a polyp.  This last colonoscopy, per his report, was normal and he was instructed to have a colonoscopy in 10 years.  Prior to that time, at the age of 49 he had a routine screening colonoscopy with Dr. Peters with findings of a polyp.  The patient does not recall being diagnosed with diverticula.  He denies any issues with abdominal pain, hematemesis, or using any NSAIDs.  On the evening of admission he had more bleeding and this was followed by syncope.  As a result of his symptoms he presented to the ER.  Past Medical History:  Diagnosis Date  . Hyperlipidemia     Past Surgical History:  Procedure Laterality Date  . APPENDECTOMY     19 76    Family History  Problem Relation Age of Onset  . Cancer Mother 55       breast    Social History:  reports that he has never smoked. He has never used smokeless tobacco. He reports current alcohol use. He reports that he does not use drugs.  Allergies: No Known Allergies  Medications:  Scheduled: . atorvastatin  40 mg Oral Daily  . fesoterodine  8 mg Oral Daily  . loratadine  10 mg Oral Daily  . montelukast  10 mg Oral QHS  . multivitamin with minerals  1 tablet Oral Daily  . polyethylene glycol-electrolytes  4,000 mL Oral Once   Continuous: . sodium chloride 125 mL/hr at 06/30/20 0340  . sodium chloride Stopped (06/30/20 0209)  . pantoprozole (PROTONIX) infusion 8  mg/hr (06/30/20 0915)    Results for orders placed or performed during the hospital encounter of 06/29/20 (from the past 24 hour(s))  CBC with Differential     Status: Abnormal   Collection Time: 06/29/20 10:50 PM  Result Value Ref Range   WBC 19.0 (H) 4.0 - 10.5 K/uL   RBC 3.76 (L) 4.22 - 5.81 MIL/uL   Hemoglobin 11.7 (L) 13.0 - 17.0 g/dL   HCT 07/01/20 (L) 54.0 - 08.6 %   MCV 96.0 80.0 - 100.0 fL   MCH 31.1 26.0 - 34.0 pg   MCHC 32.4 30.0 - 36.0 g/dL   RDW 76.1 95.0 - 93.2 %   Platelets 256 150 - 400 K/uL   nRBC 0.0 0.0 - 0.2 %   Neutrophils Relative % 84 %   Neutro Abs 16.0 (H) 1.7 - 7.7 K/uL   Lymphocytes Relative 8 %   Lymphs Abs 1.6 0.7 - 4.0 K/uL   Monocytes Relative 6 %   Monocytes Absolute 1.1 (H) 0.1 - 1.0 K/uL   Eosinophils Relative 1 %   Eosinophils Absolute 0.1 0.0 - 0.5 K/uL   Basophils Relative 0 %   Basophils Absolute 0.1 0.0 - 0.1 K/uL   Immature Granulocytes 1 %   Abs Immature Granulocytes 0.18 (H) 0.00 - 0.07 K/uL  Comprehensive metabolic panel  Status: Abnormal   Collection Time: 06/29/20 10:50 PM  Result Value Ref Range   Sodium 137 135 - 145 mmol/L   Potassium 3.7 3.5 - 5.1 mmol/L   Chloride 105 98 - 111 mmol/L   CO2 25 22 - 32 mmol/L   Glucose, Bld 139 (H) 70 - 99 mg/dL   BUN 16 6 - 20 mg/dL   Creatinine, Ser 2.54 0.61 - 1.24 mg/dL   Calcium 8.5 (L) 8.9 - 10.3 mg/dL   Total Protein 5.8 (L) 6.5 - 8.1 g/dL   Albumin 3.6 3.5 - 5.0 g/dL   AST 20 15 - 41 U/L   ALT 26 0 - 44 U/L   Alkaline Phosphatase 52 38 - 126 U/L   Total Bilirubin 0.6 0.3 - 1.2 mg/dL   GFR, Estimated >98 >26 mL/min   Anion gap 7 5 - 15  Protime-INR     Status: None   Collection Time: 06/29/20 10:50 PM  Result Value Ref Range   Prothrombin Time 13.1 11.4 - 15.2 seconds   INR 1.0 0.8 - 1.2  Type and screen [facility]     Status: None   Collection Time: 06/29/20 10:50 PM  Result Value Ref Range   ABO/RH(D) A POS    Antibody Screen NEG    Sample Expiration       07/02/2020,2359 Performed at [facility], [address], [place], [place] [address]   I-stat chem 8, ED (not at [facility] or [facility])     Status: Abnormal   Collection Time: 06/29/20 10:52 PM  Result Value Ref Range   Sodium 139 135 - 145 mmol/L   Potassium 3.6 3.5 - 5.1 mmol/L   Chloride 102 98 - 111 mmol/L   BUN 18 6 - 20 mg/dL   Creatinine, Ser 0.94 0.61 - 1.24 mg/dL   Glucose, Bld 076 (H) 70 - 99 mg/dL   Calcium, Ion 8.08 8.11 - 1.40 mmol/L   TCO2 25 22 - 32 mmol/L   Hemoglobin 11.6 (L) 13.0 - 17.0 g/dL   HCT 03.1 (L) 59.4 - 58.5 %  POC occult blood, ED     Status: Abnormal   Collection Time: 06/29/20 10:57 PM  Result Value Ref Range   Fecal Occult Bld POSITIVE (A) NEGATIVE  Resp Panel by RT-PCR (Flu A&B, Covid) Nasopharyngeal Swab     Status: None   Collection Time: 06/29/20 11:41 PM   Specimen: Nasopharyngeal Swab; Nasopharyngeal(NP) swabs in vial transport medium  Result Value Ref Range   SARS Coronavirus 2 by RT PCR NEGATIVE NEGATIVE   Influenza A by PCR NEGATIVE NEGATIVE   Influenza B by PCR NEGATIVE NEGATIVE  ABO/Rh     Status: None   Collection Time: 06/30/20 12:36 AM  Result Value Ref Range   ABO/RH(D)      A POS Performed at [facility], [address], [place], [place] [address]   CBC     Status: Abnormal   Collection Time: 06/30/20  1:12 AM  Result Value Ref Range   WBC 11.1 (H) 4.0 - 10.5 K/uL   RBC 3.31 (L) 4.22 - 5.81 MIL/uL   Hemoglobin 10.2 (L) 13.0 - 17.0 g/dL   HCT 46.2 (L) 86.3 - 81.7 %   MCV 95.8 80.0 - 100.0 fL   MCH 30.8 26.0 - 34.0 pg   MCHC 32.2 30.0 - 36.0 g/dL   RDW 71.1 65.7 - 90.3 %   Platelets 226 150 - 400 K/uL  nRBC 0.0 0.0 - 0.2 %  Basic metabolic panel     Status: Abnormal   Collection Time: 06/30/20  1:12 AM  Result Value Ref Range   Sodium 136 135 - 145 mmol/L   Potassium 4.0 3.5 - 5.1 mmol/L   Chloride 107 98 - 111 mmol/L   CO2 24 22 - 32 mmol/L   Glucose, Bld 148 (H) 70 - 99 mg/dL   BUN 15 6 - 20 mg/dL    Creatinine, Ser 0.93 0.61 - 1.24 mg/dL   Calcium 8.2 (L) 8.9 - 10.3 mg/dL   GFR, Estimated >60 >60 mL/min   Anion gap 5 5 - 15  Hemoglobin and hematocrit, blood     Status: Abnormal   Collection Time: 06/30/20  4:57 AM  Result Value Ref Range   Hemoglobin 9.8 (L) 13.0 - 17.0 g/dL   HCT 30.8 (L) 39.0 - 52.0 %     No results found.  ROS:  As stated above in the HPI otherwise negative.  Blood pressure 114/63, pulse 82, temperature 97.6 F (36.4 C), temperature source Oral, resp. rate (!) 22, SpO2 98 %.    PE: Gen: NAD, Alert and Oriented HEENT:  [place]/AT, EOMI Neck: Supple, no LAD Lungs: CTA Bilaterally CV: RRR without M/G/R ABD: Soft, NTND, +BS Ext: No C/C/E  Assessment/Plan: 1) Hematochezia. 2) Anemia. 3) Syncope.   The patient is currently stable, but he had three episodes of syncope in total.  His last hematochezia event was at 2 AM.  He is currently hemodynamically stable.  Plan: 1) EGD/colonoscopy tomorrow. 2) If the patient has hematochezia and becomes unstable, a CT angio is required.  Mazikeen Hehn D 06/30/2020, 9:40 AM     

## 2020-06-30 NOTE — Progress Notes (Signed)
PROGRESS NOTE    [facility]ector Douglas  WCB:[id number] DOB: July 29, 1960 DOA: 06/29/2020 PCP: Deatra James, MD   Brief Narrative:  HPI On 06/30/2020 by Dr. Lyda Perone Kass Herberger is a 60 y.o. male with medical history significant of HLD.  Pt presents to ED after 3 syncopal episodes with seizure like activity.  On further review it sounds like each episode occurred after pt stood up.  Pt has been having rectal bleeding with dark red blood and black stools for the past 4 days.  Symptoms constant.  No emesis (hematemesis or otherwise).  Does drink a couple of times a week but no heavy EtOH use.  No h/o cirrhosis or known liver dz.  Colonoscopy a couple of years ago in [facility].  Interim history Admitted with hematochezia and syncope.  Gastroenterology consulted and planning for colonoscopy on 07/01/2020. Assessment & Plan   Syncope secondary to GI bleed/acute blood loss anemia/symptomatic anemia -Patient was complaining of rectal bleeding with dark red bloody and black stools for 4 days prior to admission.  He was seen at his PCPs office prior to admission and his hemoglobin was 11.7 which is down from 14.  Patient was initially hypertensive with SBP's in the upper 80s and low 90s, he was given IV fluids -Continue Protonix drip -Hemoglobin down today to 9.8 -If hemoglobin continues to trend downward, will transfuse 1 unit PRBC -Gastroenterology consulted and appreciated -Planning for colonoscopy on 07/01/2020  Leukocytosis -Suspect reactive to the above -Will continue to monitor CBC.  Patient currently afebrile  Hyperlipidemia -Continue statin  DVT Prophylaxis  SCDs  Code Status: Full  Family Communication: None at bedside  Disposition Plan:  Status is: Observation  The patient will require care spanning > 2 midnights and should be moved to inpatient because: Hemodynamically unstable, IV treatments appropriate due to intensity of illness or inability to take PO and Inpatient  level of care appropriate due to severity of illness  Dispo: The patient is from: Home              Anticipated d/c is to: Home              Patient currently is not medically stable to d/c.   Difficult to place patient No  Consultants Gastroenterology  Procedures  None  Antibiotics   Anti-infectives (From admission, onward)   None      Subjective:   [facility]ector Douglas seen and examined today.  Feels worn out and tired. Denies current chest pain, shortness of breath, nausea or vomiting, headache.  States he passed out 3 times prior to coming to the hospital.    Objective:   Vitals:   06/30/20 0530 06/30/20 0600 06/30/20 0630 06/30/20 0800  BP: 104/76 120/86 123/77 114/63  Pulse: 73 91 86 82  Resp: (!) 21 (!) 29 18 (!) 22  Temp:      TempSrc:      SpO2: 100% 97% 97% 98%   No intake or output data in the 24 hours ending 06/30/20 0954 There were no vitals filed for this visit.  Exam  General: Well developed, well nourished, NAD, appears stated age  HEENT: NCAT, mucous membranes moist.   Cardiovascular: S1 S2 auscultated, no rubs, murmurs or gallops. Regular rate and rhythm.  Respiratory: Clear to auscultation bilaterally with equal chest rise  Abdomen: Soft, nontender, nondistended, + bowel sounds  Extremities: warm dry without cyanosis clubbing or edema  Neuro: AAOx3, nonfocal  Psych: appropriate mood and affect, pleasant   Data  Reviewed: I have personally reviewed following labs and imaging studies  CBC: Recent Labs  Lab 06/29/20 2250 06/29/20 2252 06/30/20 0112 06/30/20 0457  WBC 19.0*  --  11.1*  --   NEUTROABS 16.0*  --   --   --   HGB 11.7* 11.6* 10.2* 9.8*  HCT 36.1* 34.0* 31.7* 30.8*  MCV 96.0  --  95.8  --   PLT 256  --  226  --    Basic Metabolic Panel: Recent Labs  Lab 06/29/20 2250 06/29/20 2252 06/30/20 0112  NA 137 139 136  K 3.7 3.6 4.0  CL 105 102 107  CO2 25  --  24  GLUCOSE 139* 136* 148*  BUN 16 18 15   CREATININE 1.07 1.00  0.93  CALCIUM 8.5*  --  8.2*   GFR: CrCl cannot be calculated (Unknown ideal weight.). Liver Function Tests: Recent Labs  Lab 06/29/20 2250  AST 20  ALT 26  ALKPHOS 52  BILITOT 0.6  PROT 5.8*  ALBUMIN 3.6   No results for input(s): LIPASE, AMYLASE in the last 168 hours. No results for input(s): AMMONIA in the last 168 hours. Coagulation Profile: Recent Labs  Lab 06/29/20 2250  INR 1.0   Cardiac Enzymes: No results for input(s): CKTOTAL, CKMB, CKMBINDEX, TROPONINI in the last 168 hours. BNP (last 3 results) No results for input(s): PROBNP in the last 8760 hours. HbA1C: No results for input(s): HGBA1C in the last 72 hours. CBG: No results for input(s): GLUCAP in the last 168 hours. Lipid Profile: No results for input(s): CHOL, HDL, LDLCALC, TRIG, CHOLHDL, LDLDIRECT in the last 72 hours. Thyroid Function Tests: No results for input(s): TSH, T4TOTAL, FREET4, T3FREE, THYROIDAB in the last 72 hours. Anemia Panel: No results for input(s): VITAMINB12, FOLATE, FERRITIN, TIBC, IRON, RETICCTPCT in the last 72 hours. Urine analysis: No results found for: COLORURINE, APPEARANCEUR, LABSPEC, PHURINE, GLUCOSEU, HGBUR, BILIRUBINUR, KETONESUR, PROTEINUR, UROBILINOGEN, NITRITE, LEUKOCYTESUR Sepsis Labs: @LABRCNTIP (procalcitonin:4,lacticidven:4)  ) Recent Results (from the past 240 hour(s))  Resp Panel by RT-PCR (Flu A&B, Covid) Nasopharyngeal Swab     Status: None   Collection Time: 06/29/20 11:41 PM   Specimen: Nasopharyngeal Swab; Nasopharyngeal(NP) swabs in vial transport medium  Result Value Ref Range Status   SARS Coronavirus 2 by RT PCR NEGATIVE NEGATIVE Final    Comment: (NOTE) SARS-CoV-2 target nucleic acids are NOT DETECTED.  The SARS-CoV-2 RNA is generally detectable in upper respiratory specimens during the acute phase of infection. The lowest concentration of SARS-CoV-2 viral copies this assay can detect is 138 copies/mL. A negative result does not preclude  SARS-Cov-2 infection and should not be used as the sole basis for treatment or other patient management decisions. A negative result may occur with  improper specimen collection/handling, submission of specimen other than nasopharyngeal swab, presence of viral mutation(s) within the areas targeted by this assay, and inadequate number of viral copies(<138 copies/mL). A negative result must be combined with clinical observations, patient history, and epidemiological information. The expected result is Negative.  Fact Sheet for Patients:   Fact Sheet for Healthcare Providers:  07/01/20  This test is no t yet approved or cleared by the [employer] FDA and  has been authorized for detection and/or diagnosis of SARS-CoV-2 by FDA under an Emergency Use Authorization (EUA). This EUA will remain  in effect (meaning this test can be used) for the duration of the COVID-19 declaration under Section 564(b)(1) of the Act, 21 U.S.C.section 360bbb-3(b)(1), unless the authorization is terminated  or revoked sooner.       Influenza A by PCR NEGATIVE NEGATIVE Final   Influenza B by PCR NEGATIVE NEGATIVE Final    Comment: (NOTE) The Xpert Xpress SARS-CoV-2/FLU/RSV plus assay is intended as an aid in the diagnosis of influenza from Nasopharyngeal swab specimens and should not be used as a sole basis for treatment. Nasal washings and aspirates are unacceptable for Xpert Xpress SARS-CoV-2/FLU/RSV testing.  Fact Sheet for Patients: [employer]gerCourse.com  Fact Sheet for Healthcare Providers: SeriousBroker.it  This test is not yet approved or cleared by the [place] FDA and has been authorized for detection and/or diagnosis of SARS-CoV-2 by FDA under an Emergency Use Authorization (EUA). This EUA will remain in effect (meaning this test can be used) for the duration of  the COVID-19 declaration under Section 564(b)(1) of the Act, 21 U.S.C. section 360bbb-3(b)(1), unless the authorization is terminated or revoked.  Performed at [facility], [address] [place], [place] [address]       Radiology Studies: No results found.   Scheduled Meds: . atorvastatin  40 mg Oral Daily  . fesoterodine  8 mg Oral Daily  . loratadine  10 mg Oral Daily  . montelukast  10 mg Oral QHS  . multivitamin with minerals  1 tablet Oral Daily  . polyethylene glycol-electrolytes  4,000 mL Oral Once   Continuous Infusions: . sodium chloride 125 mL/hr at 06/30/20 0340  . sodium chloride Stopped (06/30/20 0209)  . pantoprozole (PROTONIX) infusion 8 mg/hr (06/30/20 0915)     LOS: 0 days   Time Spent in minutes   45 minutes  Tiffane Sheldon D.O. on 06/30/2020 at 9:54 AM  Between 7am to 7pm - Please see pager noted on [email or web address]  After 7pm go to [email or web address]  And look for the night coverage person covering for me after hours  [facility] Office  [phone]

## 2020-06-30 NOTE — ED Notes (Signed)
GI at bedside

## 2020-06-30 NOTE — H&P (Signed)
History and Physical    Hector Douglas DZH:[id number] DOB: 09-17-1960 DOA: 06/29/2020  PCP: Hector James, MD  Patient coming from: Home  I have personally briefly reviewed patient's old medical records in [facility]  Chief Complaint: Syncope, hematochezia  HPI: Hector Douglas is a 60 y.o. male with medical history significant of HLD.  Pt presents to ED after 3 syncopal episodes with seizure like activity.  On further review it sounds like each episode occurred after pt stood up.  Pt has been having rectal bleeding with dark red blood and black stools for the past 4 days.  Symptoms constant.  No emesis (hematemesis or otherwise).  Does drink a couple of times a week but no heavy EtOH use.  No h/o cirrhosis or known liver dz.  Colonoscopy a couple of years ago in [facility] [facility].   ED Course: Hemoccult positive.  HGB 11.7 down from 14 at PCP office yesterday.  Initially hypotensive to upper 80s low 90s.  Given 1L LR bolus.  PPI Gtt started.   Review of Systems: As per HPI, otherwise all review of systems negative.  Past Medical History:  Diagnosis Date  . Hyperlipidemia     Past Surgical History:  Procedure Laterality Date  . APPENDECTOMY     1977     reports that he has never smoked. He has never used smokeless tobacco. He reports current alcohol use. He reports that he does not use drugs.  No Known Allergies  Family History  Problem Relation Age of Onset  . Cancer Mother 28       breast     Prior to Admission medications   Medication Sig Start Date End Date Taking? Authorizing Provider  aspirin 81 MG tablet Take 81 mg by mouth daily.   Yes [provider]  cyclobenzaprine (FLEXERIL) 10 MG tablet Take 10 mg by mouth 3 (three) times daily as needed for muscle spasms.   Yes [provider]  loratadine (CLARITIN) 10 MG tablet Take 10 mg by mouth daily.   Yes [provider]  montelukast (SINGULAIR) 10 MG tablet Take 10 mg by mouth at  bedtime. 05/16/20  Yes [provider]  Multiple Vitamins-Minerals (CENTRUM SILVER 50+MEN) TABS Take 1 tablet by mouth daily.   Yes [provider]  simvastatin (ZOCOR) 80 MG tablet Take 80 mg by mouth daily. 06/18/20  Yes [provider]  tolterodine (DETROL LA) 4 MG 24 hr capsule Take 4 mg by mouth daily. 06/18/20  Yes [provider]  sildenafil (VIAGRA) 100 MG tablet Take 100 mg by mouth daily as needed for erectile dysfunction.    [provider]    Physical Exam: Vitals:   06/29/20 2227 06/29/20 2230 06/30/20 0037  BP: 90/62 93/74 105/67  Pulse: 83 80 73  Resp: 18 (!) 27 (!) 24  Temp:   97.6 F (36.4 C)  TempSrc:   Oral  SpO2: 94% 94% 97%    Constitutional: NAD, calm, comfortable Eyes: PERRL, lids and conjunctivae normal ENMT: Mucous membranes are moist. Posterior pharynx clear of any exudate or lesions.Normal dentition.  Neck: normal, supple, no masses, no thyromegaly Respiratory: clear to auscultation bilaterally, no wheezing, no crackles. Normal respiratory effort. No accessory muscle use.  Cardiovascular: Regular rate and rhythm, no murmurs / rubs / gallops. No extremity edema. 2+ pedal pulses. No carotid bruits.  Abdomen: no tenderness, no masses palpated. No hepatosplenomegaly. Bowel sounds positive.  Musculoskeletal: no clubbing / cyanosis. No joint deformity upper and lower  extremities. Good ROM, no contractures. Normal muscle tone.  Skin: no rashes, lesions, ulcers. No induration Neurologic: CN 2-12 grossly intact. Sensation intact, DTR normal. Strength 5/5 in all 4.  Psychiatric: Normal judgment and insight. Alert and oriented x 3. Normal mood.    Labs on Admission: I have personally reviewed following labs and imaging studies  CBC: Recent Labs  Lab 06/29/20 2250 06/29/20 2252  WBC 19.0*  --   NEUTROABS 16.0*  --   HGB 11.7* 11.6*  HCT 36.1* 34.0*  MCV 96.0  --   PLT 256  --    Basic Metabolic Panel: Recent Labs   Lab 06/29/20 2250 06/29/20 2252  NA 137 139  K 3.7 3.6  CL 105 102  CO2 25  --   GLUCOSE 139* 136*  BUN 16 18  CREATININE 1.07 1.00  CALCIUM 8.5*  --    GFR: CrCl cannot be calculated (Unknown ideal weight.). Liver Function Tests: Recent Labs  Lab 06/29/20 2250  AST 20  ALT 26  ALKPHOS 52  BILITOT 0.6  PROT 5.8*  ALBUMIN 3.6   No results for input(s): LIPASE, AMYLASE in the last 168 hours. No results for input(s): AMMONIA in the last 168 hours. Coagulation Profile: Recent Labs  Lab 06/29/20 2250  INR 1.0   Cardiac Enzymes: No results for input(s): CKTOTAL, CKMB, CKMBINDEX, TROPONINI in the last 168 hours. BNP (last 3 results) No results for input(s): PROBNP in the last 8760 hours. HbA1C: No results for input(s): HGBA1C in the last 72 hours. CBG: No results for input(s): GLUCAP in the last 168 hours. Lipid Profile: No results for input(s): CHOL, HDL, LDLCALC, TRIG, CHOLHDL, LDLDIRECT in the last 72 hours. Thyroid Function Tests: No results for input(s): TSH, T4TOTAL, FREET4, T3FREE, THYROIDAB in the last 72 hours. Anemia Panel: No results for input(s): VITAMINB12, FOLATE, FERRITIN, TIBC, IRON, RETICCTPCT in the last 72 hours. Urine analysis: No results found for: COLORURINE, APPEARANCEUR, LABSPEC, PHURINE, GLUCOSEU, HGBUR, BILIRUBINUR, KETONESUR, PROTEINUR, UROBILINOGEN, NITRITE, LEUKOCYTESUR  Radiological Exams on Admission: No results found.  EKG: Independently reviewed.  Assessment/Plan Principal Problem:   GI bleed Active Problems:   ABLA (acute blood loss anemia)   HLD (hyperlipidemia)    1. GIB with ABLA and syncope - 1. IVF: give 2nd L bolus now then 125 cc/hr LR 2. Repeat CBC/BMP in AM 3. Transfuse if needed 4. PPI GTT 5. INR, platelets, LFTs, tbili all normal, no hematemesis, no known h/o liver dz, dont really have a reason to start octreotide 6. Tele monitor 7. Clear liquid diet 8. GI consult in AM 2. HLD - Cont statin  DVT  prophylaxis: SCDs Code Status: Full Family Communication: Family at bedside Disposition Plan: Home after GIB stopped and HGB stable Consults called: Message sent to Drs. Hector Douglas for AM consult Admission status: Place in [place], [place] M. DO [facility]  How to contact the [facility] Attending or Consulting provider 7A - 7P or covering provider during after hours 7P -7A, for this patient?  1. Check the care team in [facility] and look for a) attending/consulting [facility] provider listed and b) the [facility] team listed 2. Log into [email or web address]  Amion Physician Scheduling and messaging for groups and whole hospitals  On call and physician scheduling software for group practices, residents, hospitalists and other medical providers for call, clinic, rotation and shift schedules. [employer] is a hospital-wide [facility] for scheduling doctors and paging doctors on call. EasyPlot is for scientific plotting and  data analysis.  [email or web address]  and use [facility]'s universal password to access. If you do not have the password, please contact the hospital operator.  3. Locate the [facility] provider you are looking for under [facility] and page to a number that you can be directly reached. 4. If you still have difficulty reaching the provider, please page the [facility] (Director on Call) for the Hospitalists listed on amion for assistance.  06/30/2020, 1:29 AM

## 2020-06-30 NOTE — ED Notes (Signed)
Pt given breakfast tray, assisted to sitting position to eat Educated on Clear liquid diet and transition to NPO at midnight

## 2020-07-01 ENCOUNTER — Encounter (HOSPITAL_COMMUNITY): Payer: Self-pay | Admitting: Internal Medicine

## 2020-07-01 ENCOUNTER — Encounter (HOSPITAL_COMMUNITY)
Admission: EM | Disposition: A | Discharge status: Home / Self Care | Payer: Self-pay | Source: Home / Self Care | Attending: Emergency Medicine

## 2020-07-01 ENCOUNTER — Inpatient Hospital Stay (HOSPITAL_COMMUNITY): Payer: 59 | Admitting: Anesthesiology

## 2020-07-01 DIAGNOSIS — G4700 Insomnia, unspecified: Secondary | ICD-10-CM

## 2020-07-01 DIAGNOSIS — D62 Acute posthemorrhagic anemia: Secondary | ICD-10-CM | POA: Diagnosis not present

## 2020-07-01 DIAGNOSIS — E785 Hyperlipidemia, unspecified: Secondary | ICD-10-CM | POA: Diagnosis not present

## 2020-07-01 DIAGNOSIS — K922 Gastrointestinal hemorrhage, unspecified: Secondary | ICD-10-CM | POA: Diagnosis not present

## 2020-07-01 HISTORY — PX: ESOPHAGOGASTRODUODENOSCOPY (EGD) WITH PROPOFOL: SHX5813

## 2020-07-01 HISTORY — PX: POLYPECTOMY: SHX5525

## 2020-07-01 HISTORY — PX: COLONOSCOPY WITH PROPOFOL: SHX5780

## 2020-07-01 LAB — BASIC METABOLIC PANEL
Anion gap: 4 — ABNORMAL LOW (ref 5–15)
BUN: 6 mg/dL (ref 6–20)
CO2: 26 mmol/L (ref 22–32)
Calcium: 8.1 mg/dL — ABNORMAL LOW (ref 8.9–10.3)
Chloride: 110 mmol/L (ref 98–111)
Creatinine, Ser: 0.92 mg/dL (ref 0.61–1.24)
GFR, Estimated: >60 mL/min (ref >60)
Glucose, Bld: 98 mg/dL (ref 70–99)
Potassium: 3.5 mmol/L (ref 3.5–5.1)
Sodium: 140 mmol/L (ref 135–145)

## 2020-07-01 LAB — CBC
HCT: 25.8 % — ABNORMAL LOW (ref 39.0–52.0)
Hemoglobin: 8.7 g/dL — ABNORMAL LOW (ref 13.0–17.0)
MCH: 32.1 pg (ref 26.0–34.0)
MCHC: 33.7 g/dL (ref 30.0–36.0)
MCV: 95.2 fL (ref 80.0–100.0)
Platelets: 176 10*3/uL (ref 150–400)
RBC: 2.71 MIL/uL — ABNORMAL LOW (ref 4.22–5.81)
RDW: 13.2 % (ref 11.5–15.5)
WBC: 6.4 10*3/uL (ref 4.0–10.5)
nRBC: 0 % (ref 0.0–0.2)

## 2020-07-01 SURGERY — ESOPHAGOGASTRODUODENOSCOPY (EGD) WITH PROPOFOL
Anesthesia: Monitor Anesthesia Care

## 2020-07-01 MED ORDER — SODIUM CHLORIDE 0.9 % IV SOLN: INTRAVENOUS | Status: DC

## 2020-07-01 MED ORDER — PROPOFOL 500 MG/50ML IV EMUL
INTRAVENOUS | Status: DC | PRN
  Administered 2020-07-01: 100 ug/kg/min via INTRAVENOUS

## 2020-07-01 MED ORDER — TRAZODONE HCL 50 MG PO TABS
50.0000 mg | ORAL_TABLET | Freq: Once | ORAL | Status: AC | PRN
  Administered 2020-07-01: 50 mg via ORAL
  Filled 2020-07-01: qty 1

## 2020-07-01 MED ORDER — SODIUM CHLORIDE 0.9 % IV SOLN: INTRAVENOUS | Status: DC | PRN

## 2020-07-01 MED ORDER — LIDOCAINE HCL (CARDIAC) PF 100 MG/5ML IV SOSY
PREFILLED_SYRINGE | INTRAVENOUS | Status: DC | PRN
  Administered 2020-07-01: 30 mg via INTRAVENOUS

## 2020-07-01 MED ORDER — LACTATED RINGERS IV SOLN: INTRAVENOUS | Status: DC | PRN

## 2020-07-01 SURGICAL SUPPLY — 25 items

## 2020-07-01 NOTE — Plan of Care (Signed)

## 2020-07-01 NOTE — Interval H&P Note (Signed)
History and Physical Interval Note:  07/01/2020 3:36 PM  Hector Douglas  has presented today for surgery, with the diagnosis of GI bleed.  The various methods of treatment have been discussed with the patient and family. After consideration of risks, benefits and other options for treatment, the patient has consented to  Procedure(s): ESOPHAGOGASTRODUODENOSCOPY (EGD) WITH PROPOFOL (N/A) COLONOSCOPY WITH PROPOFOL (N/A) as a surgical intervention.  The patient's history has been reviewed, patient examined, no change in status, stable for surgery.  I have reviewed the patient's chart and labs.  Questions were answered to the patient's satisfaction.     Meleah Demeyer D

## 2020-07-01 NOTE — Anesthesia Postprocedure Evaluation (Signed)
Anesthesia Post Note  Patient: Si Jachim  Procedure(s) Performed: ESOPHAGOGASTRODUODENOSCOPY (EGD) WITH PROPOFOL (N/A ) COLONOSCOPY WITH PROPOFOL (N/A ) POLYPECTOMY     Patient location during evaluation: Endoscopy Anesthesia Type: MAC Level of consciousness: awake Pain management: pain level controlled Vital Signs Assessment: post-procedure vital signs reviewed and stable Respiratory status: spontaneous breathing, nonlabored ventilation, respiratory function stable and patient connected to nasal cannula oxygen Cardiovascular status: stable and blood pressure returned to baseline Postop Assessment: no apparent nausea or vomiting Anesthetic complications: no   No complications documented.  Last Vitals:  Vitals:   07/01/20 1701 07/01/20 1939  BP: (!) 137/91 135/87  Pulse: 75 87  Resp: 20 20  Temp:  37.1 C  SpO2: 100% 100%    Last Pain:  Vitals:   07/01/20 2047  TempSrc:   PainSc: 0-No pain                 Rose Hippler P Tru Rana

## 2020-07-01 NOTE — Transfer of Care (Signed)
Immediate Anesthesia Transfer of Care Note  Patient: Hector Douglas  Procedure(s) Performed: ESOPHAGOGASTRODUODENOSCOPY (EGD) WITH PROPOFOL (N/A ) COLONOSCOPY WITH PROPOFOL (N/A ) POLYPECTOMY  Patient Location: Endoscopy Unit  Anesthesia Type:MAC  Level of Consciousness: awake and alert   Airway & Oxygen Therapy: Patient Spontanous Breathing and Patient connected to nasal cannula oxygen  Post-op Assessment: Report given to RN and Post -op Vital signs reviewed and stable  Post vital signs: Reviewed and stable  Last Vitals:  Vitals Value Taken Time  BP 100/72 07/01/20 1642  Temp 36.4 C 07/01/20 1639  Pulse 67 07/01/20 1644  Resp 20 07/01/20 1644  SpO2 100 % 07/01/20 1644  Vitals shown include unvalidated device data.  Last Pain:  Vitals:   07/01/20 1639  TempSrc: Axillary  PainSc:          Complications: No complications documented.

## 2020-07-01 NOTE — Op Note (Signed)
[facility] Patient Name: Hector Douglas Procedure Date : 07/01/2020 MRN: [id number] Attending MD: Jeani Hawking , MD Date of Birth: 1960/11/08 CSN: [id number] Age: 60 Admit Type: Inpatient Procedure:                Colonoscopy Indications:              Hematochezia Providers:                Jeani Hawking, MD, Rogue Jury, RN, Fransisca Connors, Lawson Radar, Technician, Gwenyth Allegra                            CRNA, CRNA Referring MD:              Medicines:                Propofol per Anesthesia Complications:            No immediate complications. Estimated Blood Loss:     Estimated blood loss: none. Procedure:                Pre-Anesthesia Assessment:                           - Prior to the procedure, a History and Physical                            was performed, and patient medications and                            allergies were reviewed. The patient's tolerance of                            previous anesthesia was also reviewed. The risks                            and benefits of the procedure and the sedation                            options and risks were discussed with the patient.                            All questions were answered, and informed consent                            was obtained. Prior Anticoagulants: The patient has                            taken no previous anticoagulant or antiplatelet                            agents. ASA Grade Assessment: II - A patient with                            mild systemic disease. After  reviewing the risks                            and benefits, the patient was deemed in                            satisfactory condition to undergo the procedure.                           - Sedation was administered by an anesthesia                            professional. Deep sedation was attained.                           After obtaining informed consent, the colonoscope                             was passed under direct vision. Throughout the                            procedure, the patient's blood pressure, pulse, and                            oxygen saturations were monitored continuously. The                            [id number]-[id number] ([id number] Olympus pediatric colonscope                            was introduced through the anus and advanced to the                            the terminal ileum. The colonoscopy was performed                            without difficulty. The patient tolerated the                            procedure well. The quality of the bowel                            preparation was good. The terminal ileum, ileocecal                            valve, appendiceal orifice, and rectum were                            photographed. Scope In: 4:09:30 PM Scope Out: 4:28:16 PM Scope Withdrawal Time: 0 hours 13 minutes 27 seconds  Total Procedure Duration: 0 hours 18 minutes 46 seconds  Findings:      Two sessile polyps were found in the rectum and descending colon. The       polyps were 2 to 3 mm in size. These polyps were removed with a cold  snare. Resection and retrieval were complete.      Scattered small and large-mouthed diverticula were found in the sigmoid       colon and descending colon.      The current findings are consistent with a diverticular bleed. There was       evidence of some faint blood tinged liquid stool. A clot was noted to be       coming out of a diverticulum. Impression:               - Two 2 to 3 mm polyps in the rectum and in the                            descending colon, removed with a cold snare.                            Resected and retrieved.                           - Diverticulosis in the sigmoid colon and in the                            descending colon. Recommendation:           - Return patient to hospital ward for ongoing care.                           - Resume regular diet.                            - Continue present medications.                           - Await pathology results.                           - Repeat colonoscopy in 7-10 years for surveillance.                           - Follow HGB and transfuse.                           - If no further bleeding by tomorrow he can be                            discharged home.                           - Signing off. Procedure Code(s):        --- Professional ---                           [phone], Colonoscopy, flexible; with removal of                            tumor(s), polyp(s), or other lesion(s) by snare  technique Diagnosis Code(s):        --- Professional ---                           K62.1, Rectal polyp                           K63.5, Polyp of colon                           K92.1, Melena (includes Hematochezia)                           K57.30, Diverticulosis of large intestine without                            perforation or abscess without bleeding CPT copyright 2019 American Medical Association. All rights reserved. The codes documented in this report are preliminary and upon coder review may  be revised to meet current compliance requirements. Jeani Hawking, MD Jeani Hawking, MD 07/01/2020 4:45:33 PM This report has been signed electronically. Number of Addenda: 0

## 2020-07-01 NOTE — Op Note (Signed)
[facility] Patient Name: Hector Douglas Procedure Date : 07/01/2020 MRN: [id number] Attending MD: Jeani Hawking , MD Date of Birth: 1961-03-30 CSN: [id number] Age: 60 Admit Type: Inpatient Procedure:                Upper GI endoscopy Indications:              Acute post hemorrhagic anemia Providers:                Jeani Hawking, MD, Rogue Jury, RN, Fransisca Connors, Lawson Radar, Technician, Gwenyth Allegra                            CRNA, CRNA Referring MD:              Medicines:                Propofol per Anesthesia Complications:            No immediate complications. Estimated Blood Loss:     Estimated blood loss: none. Procedure:                Pre-Anesthesia Assessment:                           - Prior to the procedure, a History and Physical                            was performed, and patient medications and                            allergies were reviewed. The patient's tolerance of                            previous anesthesia was also reviewed. The risks                            and benefits of the procedure and the sedation                            options and risks were discussed with the patient.                            All questions were answered, and informed consent                            was obtained. Prior Anticoagulants: The patient has                            taken no previous anticoagulant or antiplatelet                            agents. ASA Grade Assessment: II - A patient with  mild systemic disease. After reviewing the risks                            and benefits, the patient was deemed in                            satisfactory condition to undergo the procedure.                           - Sedation was administered by an anesthesia                            professional. Deep sedation was attained.                           After obtaining informed consent, the  endoscope was                            passed under direct vision. Throughout the                            procedure, the patient's blood pressure, pulse, and                            oxygen saturations were monitored continuously. The                            PCF-[id number] ([id number] Olympus pediatric colonscope                            was introduced through the mouth, and advanced to                            the second part of duodenum. The upper GI endoscopy                            was accomplished without difficulty. The patient                            tolerated the procedure well. Scope In: Scope Out: Findings:      The esophagus was normal.      The stomach was normal.      The examined duodenum was normal. Impression:               - Normal esophagus.                           - Normal stomach.                           - Normal examined duodenum.                           - No specimens collected. Recommendation:           - Proceed with the colonoscopy. Procedure Code(s):        ---  Professional ---                           [phone], Esophagogastroduodenoscopy, flexible,                            transoral; diagnostic, including collection of                            specimen(s) by brushing or washing, when performed                            (separate procedure) Diagnosis Code(s):        --- Professional ---                           D62, Acute posthemorrhagic anemia CPT copyright 2019 American Medical Association. All rights reserved. The codes documented in this report are preliminary and upon coder review may  be revised to meet current compliance requirements. Jeani Hawking, MD Jeani Hawking, MD 07/01/2020 4:40:36 PM This report has been signed electronically. Number of Addenda: 0

## 2020-07-01 NOTE — Progress Notes (Signed)
[facility] night shift.  The nursing staff reported that the patient stated that he has not slept much in the past few days and would like something to help him sleep.  Trazodone 50 mg p.o. as needed ordered.  Sanda Klein, MD.

## 2020-07-01 NOTE — Anesthesia Procedure Notes (Signed)
Procedure Name: MAC Date/Time: 07/01/2020 4:04 PM Performed by: Eligha Bridegroom, CRNA Pre-anesthesia Checklist: Patient identified, Emergency Drugs available, Suction available, Patient being monitored and Timeout performed Patient Re-evaluated:Patient Re-evaluated prior to induction Oxygen Delivery Method: Nasal cannula Preoxygenation: Pre-oxygenation with 100% oxygen Induction Type: IV induction

## 2020-07-01 NOTE — Anesthesia Preprocedure Evaluation (Signed)
Anesthesia Evaluation  Patient identified by MRN, date of birth, ID band Patient awake    Reviewed: Allergy & Precautions, NPO status , Patient's Chart, lab work & pertinent test results  Airway Mallampati: II  TM Distance: >3 FB Neck ROM: Full    Dental  (+) Teeth Intact, Dental Advisory Given   Pulmonary neg pulmonary ROS,    Pulmonary exam normal breath sounds clear to auscultation       Cardiovascular negative cardio ROS Normal cardiovascular exam Rhythm:Regular Rate:Normal     Neuro/Psych negative neurological ROS     GI/Hepatic Neg liver ROS, Gi bleed   Endo/Other  negative endocrine ROS  Renal/GU negative Renal ROS     Musculoskeletal negative musculoskeletal ROS (+)   Abdominal   Peds  Hematology  (+) Blood dyscrasia, anemia ,   Anesthesia Other Findings Day of surgery medications reviewed with the patient.  Reproductive/Obstetrics                             Anesthesia Physical Anesthesia Plan  ASA: II  Anesthesia Plan: MAC   Post-op Pain Management:    Induction: Intravenous  PONV Risk Score and Plan: 1 and Propofol infusion and Treatment may vary due to age or medical condition  Airway Management Planned: Nasal Cannula and Natural Airway  Additional Equipment:   Intra-op Plan:   Post-operative Plan:   Informed Consent: I have reviewed the patients History and Physical, chart, labs and discussed the procedure including the risks, benefits and alternatives for the proposed anesthesia with the patient or authorized representative who has indicated his/her understanding and acceptance.     Dental advisory given  Plan Discussed with: CRNA and Anesthesiologist  Anesthesia Plan Comments:         Anesthesia Quick Evaluation

## 2020-07-01 NOTE — Progress Notes (Addendum)
PROGRESS NOTE    Hector Douglas  IHK:[id number] DOB: 13-Oct-1960 DOA: 06/29/2020 PCP: Deatra James, MD   Brief Narrative:  HPI On 06/30/2020 by Dr. Lyda Perone Hector Douglas is a 60 y.o. male with medical history significant of HLD.  Pt presents to ED after 3 syncopal episodes with seizure like activity.  On further review it sounds like each episode occurred after pt stood up.  Pt has been having rectal bleeding with dark red blood and black stools for the past 4 days.  Symptoms constant.  No emesis (hematemesis or otherwise).  Does drink a couple of times a week but no heavy EtOH use.  No h/o cirrhosis or known liver dz.  Colonoscopy a couple of years ago in [facility].  Interim history Admitted with hematochezia and syncope.  Gastroenterology consulted and planning for EGD and colonoscopy on 07/01/2020. Assessment & Plan   Syncope secondary to GI bleed/acute blood loss anemia/symptomatic anemia -Patient was complaining of rectal bleeding with dark red bloody and black stools for 4 days prior to admission.  He was seen at his PCPs office prior to admission and his hemoglobin was 11.7 which is down from 14.  Patient was initially hypertensive with SBP's in the upper 80s and low 90s, he was given IV fluids -Continue Protonix drip -Per RN documentation, patient continued to have bloody bowel movements overnight -Hemoglobin down today to 8.7 -If hemoglobin continues to trend downward, will transfuse -Gastroenterology consulted and appreciated -Planning for EGD and colonoscopy today 07/01/2020  Leukocytosis -Suspect reactive to the above -Resolved  Hyperlipidemia -Continue statin  DVT Prophylaxis  SCDs  Code Status: Full  Family Communication: None at bedside  Disposition Plan:  Status is: Inpatient  Remains inpatient due to  Hemodynamically unstable, IV treatments appropriate due to intensity of illness or inability to take PO and Inpatient level of care appropriate due  to severity of illness  Dispo: The patient is from: Home              Anticipated d/c is to: Home              Patient currently is not medically stable to d/c.   Difficult to place patient No  Consultants Gastroenterology  Procedures  None  Antibiotics   Anti-infectives (From admission, onward)   None      Subjective:   Hector Douglas seen and examined today.  Feels extremely tired this morning and states he was unable to rest well last night.  Currently denies chest pain, shortness of breath, abdominal pain, nausea or vomiting, headache.  Has not had any dizziness since coming to the hospital.     Objective:   Vitals:   06/30/20 1718 06/30/20 2016 07/01/20 0432 07/01/20 0748  BP: 126/90 (!) 162/89 129/69 118/79  Pulse: 77 96 72 72  Resp: 20 17 20  (!) 23  Temp: 98.4 F (36.9 C) 98 F (36.7 C) 98.2 F (36.8 C) 98.5 F (36.9 C)  TempSrc: Oral Oral Oral Oral  SpO2: 98%       Intake/Output Summary (Last 24 hours) at 07/01/2020 1112 Last data filed at 07/01/2020 0750 Gross per 24 hour  Intake 5505.52 ml  Output 2500 ml  Net 3005.52 ml   There were no vitals filed for this visit.  Exam  General: Well developed, well nourished, NAD, appears stated age  HEENT: NCAT,  mucous membranes moist.   Cardiovascular: S1 S2 auscultated, RRR, no murmur  Respiratory: Clear to auscultation bilaterally with equal chest  rise  Abdomen: Soft, nontender, nondistended, + bowel sounds  Extremities: warm dry without cyanosis clubbing or edema  Neuro: AAOx3, nonfocal  Psych: pleasant, appropriate mood and affect   Data Reviewed: I have personally reviewed following labs and imaging studies  CBC: Recent Labs  Lab 06/29/20 2250 06/29/20 2252 06/30/20 0112 06/30/20 0457 07/01/20 0402  WBC 19.0*  --  11.1*  --  6.4  NEUTROABS 16.0*  --   --   --   --   HGB 11.7* 11.6* 10.2* 9.8* 8.7*  HCT 36.1* 34.0* 31.7* 30.8* 25.8*  MCV 96.0  --  95.8  --  95.2  PLT 256  --  226  --   176   Basic Metabolic Panel: Recent Labs  Lab 06/29/20 2250 06/29/20 2252 06/30/20 0112 07/01/20 0402  NA 137 139 136 140  K 3.7 3.6 4.0 3.5  CL 105 102 107 110  CO2 25  --  24 26  GLUCOSE 139* 136* 148* 98  BUN 16 18 15 6   CREATININE 1.07 1.00 0.93 0.92  CALCIUM 8.5*  --  8.2* 8.1*   GFR: CrCl cannot be calculated (Unknown ideal weight.). Liver Function Tests: Recent Labs  Lab 06/29/20 2250  AST 20  ALT 26  ALKPHOS 52  BILITOT 0.6  PROT 5.8*  ALBUMIN 3.6   No results for input(s): LIPASE, AMYLASE in the last 168 hours. No results for input(s): AMMONIA in the last 168 hours. Coagulation Profile: Recent Labs  Lab 06/29/20 2250  INR 1.0   Cardiac Enzymes: No results for input(s): CKTOTAL, CKMB, CKMBINDEX, TROPONINI in the last 168 hours. BNP (last 3 results) No results for input(s): PROBNP in the last 8760 hours. HbA1C: No results for input(s): HGBA1C in the last 72 hours. CBG: No results for input(s): GLUCAP in the last 168 hours. Lipid Profile: No results for input(s): CHOL, HDL, LDLCALC, TRIG, CHOLHDL, LDLDIRECT in the last 72 hours. Thyroid Function Tests: No results for input(s): TSH, T4TOTAL, FREET4, T3FREE, THYROIDAB in the last 72 hours. Anemia Panel: No results for input(s): VITAMINB12, FOLATE, FERRITIN, TIBC, IRON, RETICCTPCT in the last 72 hours. Urine analysis: No results found for: COLORURINE, APPEARANCEUR, LABSPEC, PHURINE, GLUCOSEU, HGBUR, BILIRUBINUR, KETONESUR, PROTEINUR, UROBILINOGEN, NITRITE, LEUKOCYTESUR Sepsis Labs: @LABRCNTIP (procalcitonin:4,lacticidven:4)  ) Recent Results (from the past 240 hour(s))  Resp Panel by RT-PCR (Flu A&B, Covid) Nasopharyngeal Swab     Status: None   Collection Time: 06/29/20 11:41 PM   Specimen: Nasopharyngeal Swab; Nasopharyngeal(NP) swabs in vial transport medium  Result Value Ref Range Status   SARS Coronavirus 2 by RT PCR NEGATIVE NEGATIVE Final    Comment: (NOTE) SARS-CoV-2 target nucleic acids are  NOT DETECTED.  The SARS-CoV-2 RNA is generally detectable in upper respiratory specimens during the acute phase of infection. The lowest concentration of SARS-CoV-2 viral copies this assay can detect is 138 copies/mL. A negative result does not preclude SARS-Cov-2 infection and should not be used as the sole basis for treatment or other patient management decisions. A negative result may occur with  improper specimen collection/handling, submission of specimen other than nasopharyngeal swab, presence of viral mutation(s) within the areas targeted by this assay, and inadequate number of viral copies(<138 copies/mL). A negative result must be combined with clinical observations, patient history, and epidemiological information. The expected result is Negative.  Fact Sheet for Patients:   Fact Sheet for Healthcare Providers:  07/01/20  This test is no t yet approved or cleared by the [employer] and  has been authorized for detection and/or diagnosis of SARS-CoV-2 by FDA under an Emergency Use Authorization (EUA). This EUA will remain  in effect (meaning this test can be used) for the duration of the COVID-19 declaration under Section 564(b)(1) of the Act, 21 U.S.C.section 360bbb-3(b)(1), unless the authorization is terminated  or revoked sooner.       Influenza A by PCR NEGATIVE NEGATIVE Final   Influenza B by PCR NEGATIVE NEGATIVE Final    Comment: (NOTE) The Xpert Xpress SARS-CoV-2/FLU/RSV plus assay is intended as an aid in the diagnosis of influenza from Nasopharyngeal swab specimens and should not be used as a sole basis for treatment. Nasal washings and aspirates are unacceptable for Xpert Xpress SARS-CoV-2/FLU/RSV testing.  Fact Sheet for Patients: [employer]  Fact Sheet for Healthcare Providers: [employer]  This test is not  yet approved or cleared by the [place] FDA and has been authorized for detection and/or diagnosis of SARS-CoV-2 by FDA under an Emergency Use Authorization (EUA). This EUA will remain in effect (meaning this test can be used) for the duration of the COVID-19 declaration under Section 564(b)(1) of the Act, 21 U.S.C. section 360bbb-3(b)(1), unless the authorization is terminated or revoked.  Performed at [facility], [address] [place], [place] [address]       Radiology Studies: No results found.   Scheduled Meds: . atorvastatin  40 mg Oral Daily  . fesoterodine  8 mg Oral Daily  . loratadine  10 mg Oral Daily  . montelukast  10 mg Oral QHS  . multivitamin with minerals  1 tablet Oral Daily   Continuous Infusions: . sodium chloride 125 mL/hr at 07/01/20 1020  . sodium chloride Stopped (06/30/20 0209)  . sodium chloride    . pantoprozole (PROTONIX) infusion 8 mg/hr (07/01/20 0540)     LOS: 1 day   Time Spent in minutes   30 minutes  Donye Dauenhauer D.O. on 07/01/2020 at 11:12 AM  Between 7am to 7pm - Please see pager noted on [email or web address]  After 7pm go to [email or web address]  And look for the night coverage person covering for me after hours  [facility] Office  [phone]

## 2020-07-02 DIAGNOSIS — D62 Acute posthemorrhagic anemia: Secondary | ICD-10-CM | POA: Diagnosis not present

## 2020-07-02 DIAGNOSIS — E876 Hypokalemia: Secondary | ICD-10-CM | POA: Diagnosis not present

## 2020-07-02 DIAGNOSIS — K5791 Diverticulosis of intestine, part unspecified, without perforation or abscess with bleeding: Secondary | ICD-10-CM

## 2020-07-02 DIAGNOSIS — E785 Hyperlipidemia, unspecified: Secondary | ICD-10-CM | POA: Diagnosis not present

## 2020-07-02 LAB — BASIC METABOLIC PANEL
Anion gap: 6 (ref 5–15)
BUN: 6 mg/dL (ref 6–20)
CO2: 24 mmol/L (ref 22–32)
Calcium: 8.2 mg/dL — ABNORMAL LOW (ref 8.9–10.3)
Chloride: 108 mmol/L (ref 98–111)
Creatinine, Ser: 0.92 mg/dL (ref 0.61–1.24)
GFR, Estimated: >60 mL/min (ref >60)
Glucose, Bld: 107 mg/dL — ABNORMAL HIGH (ref 70–99)
Potassium: 3.3 mmol/L — ABNORMAL LOW (ref 3.5–5.1)
Sodium: 138 mmol/L (ref 135–145)

## 2020-07-02 LAB — HEMOGLOBIN AND HEMATOCRIT, BLOOD
HCT: 24.5 % — ABNORMAL LOW (ref 39.0–52.0)
Hemoglobin: 7.9 g/dL — ABNORMAL LOW (ref 13.0–17.0)

## 2020-07-02 MED ORDER — POTASSIUM CHLORIDE CRYS ER 20 MEQ PO TBCR
40.0000 meq | EXTENDED_RELEASE_TABLET | Freq: Once | ORAL | Status: AC
  Administered 2020-07-02: 40 meq via ORAL
  Filled 2020-07-02: qty 2

## 2020-07-02 NOTE — Discharge Instructions (Signed)
Gastrointestinal Bleeding Gastrointestinal (GI) bleeding is bleeding somewhere along the path that food travels through the body (digestive tract). This path is anywhere between the mouth and the opening of the butt (anus). You may have blood in your poop (stool) or have black poop. If you throw up (vomit), there may be blood in it. This condition can be mild, serious, or even life-threatening. If you have a lot of bleeding, you may need to stay in the hospital. What are the causes? This condition may be caused by:  Irritation and swelling of the esophagus (esophagitis). The esophagus is part of the body that moves food from your mouth to your stomach.  Swollen veins in the butt (hemorrhoids).  Areas of painful tearing in the opening of the butt (anal fissures). These are often caused by passing hard poop.  Pouches that form on the colon over time (diverticulosis).  Irritation and swelling (diverticulitis) in areas where pouches have formed on the colon.  Growths (polyps) or cancer. Colon cancer often starts out as growths that are not cancer.  Irritation of the stomach lining (gastritis).  Sores (ulcers) in the stomach. What increases the risk? You are more likely to develop this condition if you:  Have a certain type of infection in your stomach (Helicobacter pylori infection).  Take certain medicines.  Smoke.  Drink alcohol. What are the signs or symptoms? Common symptoms of this condition include:  Throwing up (vomiting) material that has bright red blood in it. It may look like coffee grounds.  Changes in your poop. The poop may: ? Have red blood in it. ? Be black, look like tar, and smell stronger than normal. ? Be red.  Pain or cramping in the belly (abdomen). How is this treated? Treatment for this condition depends on the cause of the bleeding. For example:  Sometimes, the bleeding can be stopped during a procedure that is done to find the problem (endoscopy or  colonoscopy).  Medicines can be used to: ? Help control irritation, swelling, or infection. ? Reduce acid in your stomach.  Certain problems can be treated with: ? Creams. ? Medicines that are put in the butt (suppositories). ? Warm baths.  Surgery is sometimes needed.  If you lose a lot of blood, you may need a blood transfusion. If bleeding is mild, you may be allowed to go home. If there is a lot of bleeding, you will need to stay in the hospital. Follow these instructions at home:  Take over-the-counter and prescription medicines only as told by your doctor.  Eat foods that have a lot of fiber in them. These foods include beans, whole grains, and fresh fruits and vegetables. You can also try eating 1-3 prunes each day.  Drink enough fluid to keep your pee (urine) pale yellow.  Keep all follow-up visits as told by your doctor. This is important.   Contact a doctor if:  Your symptoms do not get better. Get help right away if:  Your bleeding does not stop.  You feel dizzy or you pass out (faint).  You feel weak.  You have very bad cramps in your back or belly.  You pass large clumps of blood (clots) in your poop.  Your symptoms are getting worse.  You have chest pain or fast heartbeats. Summary  GI bleeding is bleeding somewhere along the path that food travels through the body (digestive tract).  This bleeding can be caused by many things. Treatment depends on the cause of the bleeding.    Take medicines only as told by your doctor.  Keep all follow-up visits as told by your doctor. This is important. This information is not intended to replace advice given to you by your health care provider. Make sure you discuss any questions you have with your health care provider. Document Revised: 10/30/2017 Document Reviewed: 10/30/2017 Elsevier Patient Education  2021 Elsevier Inc.  Diverticulosis  Diverticulosis is a condition that develops when small pouches  (diverticula) form in the wall of the large intestine (colon). The colon is where water is absorbed and stool (feces) is formed. The pouches form when the inside layer of the colon pushes through weak spots in the outer layers of the colon. You may have a few pouches or many of them. The pouches usually do not cause problems unless they become inflamed or infected. When this happens, the condition is called diverticulitis. What are the causes? The cause of this condition is not known. What increases the risk? The following factors may make you more likely to develop this condition:  Being older than age 85. Your risk for this condition increases with age. Diverticulosis is rare among people younger than age 63. By age 46, many people have it.  Eating a low-fiber diet.  Having frequent constipation.  Being overweight.  Not getting enough exercise.  Smoking.  Taking over-the-counter pain medicines, like aspirin and ibuprofen.  Having a family history of diverticulosis. What are the signs or symptoms? In most people, there are no symptoms of this condition. If you do have symptoms, they may include:  Bloating.  Cramps in the abdomen.  Constipation or diarrhea.  Pain in the lower left side of the abdomen. How is this diagnosed? Because diverticulosis usually has no symptoms, it is most often diagnosed during an exam for other colon problems. The condition may be diagnosed by:  Using a flexible scope to examine the colon (colonoscopy).  Taking an X-ray of the colon after dye has been put into the colon (barium enema).  Having a CT scan. How is this treated? You may not need treatment for this condition. Your health care provider may recommend treatment to prevent problems. You may need treatment if you have symptoms or if you previously had diverticulitis. Treatment may include:  Eating a high-fiber diet.  Taking a fiber supplement.  Taking a live bacteria supplement  (probiotic).  Taking medicine to relax your colon.   Follow these instructions at home: Medicines  Take over-the-counter and prescription medicines only as told by your health care provider.  If told by your health care provider, take a fiber supplement or probiotic. Constipation prevention Your condition may cause constipation. To prevent or treat constipation, you may need to:  Drink enough fluid to keep your urine pale yellow.  Take over-the-counter or prescription medicines.  Eat foods that are high in fiber, such as beans, whole grains, and fresh fruits and vegetables.  Limit foods that are high in fat and processed sugars, such as fried or sweet foods.   General instructions  Try not to strain when you have a bowel movement.  Keep all follow-up visits as told by your health care provider. This is important. Contact a health care provider if you:  Have pain in your abdomen.  Have bloating.  Have cramps.  Have not had a bowel movement in 3 days. Get help right away if:  Your pain gets worse.  Your bloating becomes very bad.  You have a fever or chills, and your symptoms  suddenly get worse.  You vomit.  You have bowel movements that are bloody or black.  You have bleeding from your rectum. Summary  Diverticulosis is a condition that develops when small pouches (diverticula) form in the wall of the large intestine (colon).  You may have a few pouches or many of them.  This condition is most often diagnosed during an exam for other colon problems.  Treatment may include increasing the fiber in your diet, taking supplements, or taking medicines. This information is not intended to replace advice given to you by your health care provider. Make sure you discuss any questions you have with your health care provider. Document Revised: 10/16/2018 Document Reviewed: 10/16/2018 Elsevier Patient Education  2021 Elsevier Inc.  Syncope Syncope is when you pass out  (faint) for a short time. It is caused by a sudden decrease in blood flow to the brain. Signs that you may be about to pass out include:  Feeling dizzy or light-headed.  Feeling sick to your stomach (nauseous).  Seeing all white or all black.  Having cold, clammy skin. If you pass out, get help right away. Call your local emergency services (911 in the [place] Do not drive yourself to the hospital. Follow these instructions at home: Watch for any changes in your symptoms. Take these actions to stay safe and help with your symptoms: Lifestyle  Do not drive, use machinery, or play sports until your doctor says it is okay.  Do not drink alcohol.  Do not use any products that contain nicotine or tobacco, such as cigarettes and e-cigarettes. If you need help quitting, ask your doctor.  Drink enough fluid to keep your pee (urine) pale yellow. General instructions  Take over-the-counter and prescription medicines only as told by your doctor.  If you are taking blood pressure or heart medicine, sit up and stand up slowly. Spend a few minutes getting ready to sit and then stand. This can help you feel less dizzy.  Have someone stay with you until you feel stable.  If you start to feel like you might pass out, lie down right away and raise (elevate) your feet above the level of your heart. Breathe deeply and steadily. Wait until all of the symptoms are gone.  Keep all follow-up visits as told by your doctor. This is important. Get help right away if:  You have a very bad headache.  You pass out once or more than once.  You have pain in your chest, belly, or back.  You have a very fast or uneven heartbeat (palpitations).  It hurts to breathe.  You are bleeding from your mouth or your bottom (rectum).  You have black or tarry poop (stool).  You have jerky movements that you cannot control (seizure).  You are confused.  You have trouble walking.  You are very weak.  You have  vision problems. These symptoms may be an emergency. Do not wait to see if the symptoms will go away. Get medical help right away. Call your local emergency services (911 in the [place] Do not drive yourself to the hospital. Summary  Syncope is when you pass out (faint) for a short time. It is caused by a sudden decrease in blood flow to the brain.  Signs that you may be about to faint include feeling dizzy, light-headed, or sick to your stomach, seeing all white or all black, or having cold, clammy skin.  If you start to feel like you might pass out, lie  down right away and raise (elevate) your feet above the level of your heart. Breathe deeply and steadily. Wait until all of the symptoms are gone. This information is not intended to replace advice given to you by your health care provider. Make sure you discuss any questions you have with your health care provider. Document Revised: 04/29/2019 Document Reviewed: 05/01/2017 Elsevier Patient Education  2021 [employer].

## 2020-07-02 NOTE — Plan of Care (Signed)
Problem: Education: Goal: Ability to identify signs and symptoms of gastrointestinal bleeding will improve 07/02/2020 1101 by Ariyah Sedlack, RN Outcome: Adequate for Discharge 07/02/2020 1101 by Merrilyn Legler, RN Outcome: Adequate for Discharge   Problem: Bowel/Gastric: Goal: Will show no signs and symptoms of gastrointestinal bleeding 07/02/2020 1101 by Mostyn Varnell, RN Outcome: Adequate for Discharge 07/02/2020 1101 by Chet Greenley, RN Outcome: Adequate for Discharge   Problem: Fluid Volume: Goal: Will show no signs and symptoms of excessive bleeding 07/02/2020 1101 by Nitisha Civello, RN Outcome: Adequate for Discharge 07/02/2020 1101 by Neythan Kozlov, RN Outcome: Adequate for Discharge   Problem: Clinical Measurements: Goal: Complications related to the disease process, condition or treatment will be avoided or minimized 07/02/2020 1101 by Ewelina Naves, RN Outcome: Adequate for Discharge 07/02/2020 1101 by Tannar Broker, RN Outcome: Adequate for Discharge   Problem: Education: Goal: Knowledge of General Education information will improve Description: Including pain rating scale, medication(s)/side effects and non-pharmacologic comfort measures 07/02/2020 1101 by Rosalia Mcavoy, RN Outcome: Adequate for Discharge 07/02/2020 1101 by Sacoya Mcgourty, RN Outcome: Adequate for Discharge   Problem: Health Behavior/Discharge Planning: Goal: Ability to manage health-related needs will improve 07/02/2020 1101 by Kensy Blizard, RN Outcome: Adequate for Discharge 07/02/2020 1101 by Brigitt Mcclish, RN Outcome: Adequate for Discharge   Problem: Clinical Measurements: Goal: Ability to maintain clinical measurements within normal limits will improve 07/02/2020 1101 by Virginia Francisco, RN Outcome: Adequate for Discharge 07/02/2020 1101 by Chales Pelissier, RN Outcome: Adequate for Discharge Goal: Will remain free from infection 07/02/2020 1101  by Vander Kueker, RN Outcome: Adequate for Discharge 07/02/2020 1101 by Sheria Rosello, RN Outcome: Adequate for Discharge Goal: Diagnostic test results will improve 07/02/2020 1101 by Oluwaferanmi Wain, RN Outcome: Adequate for Discharge 07/02/2020 1101 by Brentton Wardlow, RN Outcome: Adequate for Discharge Goal: Respiratory complications will improve 07/02/2020 1101 by Shylo Dillenbeck, RN Outcome: Adequate for Discharge 07/02/2020 1101 by Arash Karstens, RN Outcome: Adequate for Discharge Goal: Cardiovascular complication will be avoided 07/02/2020 1101 by Khalia Gong, RN Outcome: Adequate for Discharge 07/02/2020 1101 by Lukah Goswami, RN Outcome: Adequate for Discharge   Problem: Activity: Goal: Risk for activity intolerance will decrease 07/02/2020 1101 by Marilou Barnfield, RN Outcome: Adequate for Discharge 07/02/2020 1101 by Ady Heimann, RN Outcome: Adequate for Discharge   Problem: Nutrition: Goal: Adequate nutrition will be maintained 07/02/2020 1101 by Jillisa Harris, RN Outcome: Adequate for Discharge 07/02/2020 1101 by Raymona Boss, RN Outcome: Adequate for Discharge   Problem: Coping: Goal: Level of anxiety will decrease 07/02/2020 1101 by Haruka Kowaleski, RN Outcome: Adequate for Discharge 07/02/2020 1101 by Jerzy Roepke, RN Outcome: Adequate for Discharge   Problem: Elimination: Goal: Will not experience complications related to bowel motility 07/02/2020 1101 by Carmell Elgin, RN Outcome: Adequate for Discharge 07/02/2020 1101 by Madyx Delfin, RN Outcome: Adequate for Discharge Goal: Will not experience complications related to urinary retention 07/02/2020 1101 by Demico Ploch, RN Outcome: Adequate for Discharge 07/02/2020 1101 by Theodore Virgin, RN Outcome: Adequate for Discharge   Problem: Pain Managment: Goal: General experience of comfort will improve 07/02/2020 1101 by Ubah Radke,  RN Outcome: Adequate for Discharge 07/02/2020 1101 by Ramiya Delahunty, RN Outcome: Adequate for Discharge   Problem: Safety: Goal: Ability to remain free from injury will improve 07/02/2020 1101 by Jose Alleyne, RN Outcome: Adequate for Discharge 07/02/2020 1101 by Damario Gillie, RN Outcome: Adequate for Discharge   Problem: Skin Integrity: Goal: Risk for impaired skin integrity will decrease 07/02/2020 1101 by Kataleya Zaugg, RN Outcome: Adequate   for Discharge 07/02/2020 1101 by Kennedy Bucker, RN Outcome: Adequate for Discharge

## 2020-07-02 NOTE — Discharge Summary (Signed)
Physician Discharge Summary  Hector Douglas PXT:[id number] DOB: Jul 04, 1960 DOA: 06/29/2020  PCP: Deatra James, MD  Admit date: 06/29/2020 Discharge date: 07/02/2020  Time spent: 45 minutes  Recommendations for Outpatient Follow-up:  Patient will be discharged to home.  Patient will need to follow up with primary care provider within one week of discharge, repeat CBC and BMP.  Patient should continue medications as prescribed.  Patient should follow a heart healthy diet.   Discharge Diagnoses:  Syncope secondary to Diverticular GI bleed/acute blood loss anemia/symptomatic anemia Leukocytosis Hyperlipidemia Hypokalemia  Discharge Condition: Stable  Diet recommendation: heart healthy   Filed Weights   07/01/20 1602  Weight: 100 kg    History of present illness:  On 06/30/2020 by Dr. Eldred Manges [facility] a 59 y.o.malewith medical history significant ofHLD. Pt presents to ED after 3 syncopal episodes with seizure like activity. On further review it sounds like each episode occurred after pt stood up.  Pt has been having rectal bleeding with dark red blood and black stools for the past 4 days. Symptoms constant.  No emesis (hematemesis or otherwise).  Does drink a couple of times a week but no heavy EtOH use.  No h/o cirrhosis or known liver dz.  Colonoscopy a couple of years ago in [facility] system.  Hospital Course:  Syncope secondary to diverticular GI bleed/acute blood loss anemia/symptomatic anemia -Patient was complaining of rectal bleeding with dark red bloody and black stools for 4 days prior to admission.  He was seen at his PCPs office prior to admission and his hemoglobin was 11.7 which is down from 14.  Patient was initially hypertensive with SBP's in the upper 80s and low 90s, he was given IV fluids -Continue Protonix drip -Per RN documentation, patient continued to have bloody bowel movements overnight -Hemoglobin down today to 7.9 -FOBT  + -Gastroenterology consulted and appreciated -EGD was unremarkable -Colonoscopy showed Two 2 to 3 mm polyps in the rectum and ascending colon which were removed with cold snare.  Diverticulosis in the sigmoid colon and descending colon.  Suspect diverticular bleed, there was evidence of some faint blood tinged liquid stool, clot was noted to be coming out of a diverticulum.  Repeat colonoscopy in 7 to 10 years. -Orthostatic vitals negative -Repeat CBC in one week  Leukocytosis -Suspect reactive to the above -Resolved  Hyperlipidemia -Continue statin  Hypokalemia -repeat BMP  Procedures: EGD Colonoscopy  Consultations: Gastroenterology  Discharge Exam: Vitals:   07/02/20 0800 07/02/20 1015  BP: 134/80 123/74  Pulse:    Resp: (!) 26 17  Temp:    SpO2: 97%      General: Well developed, well nourished, NAD, appears stated age  HEENT: NCAT, mucous membranes moist.  Cardiovascular: S1 S2 auscultated, RRR, no murmur  Respiratory: Clear to auscultation bilaterally with equal chest rise  Abdomen: Soft, nontender, nondistended, + bowel sounds  Extremities: warm dry without cyanosis clubbing or edema  Neuro: AAOx3, nonfocal  Psych: pleasant, appropriate mood and affect  Discharge Instructions Discharge Instructions    Discharge instructions   Complete by: As directed    Patient will be discharged to home.  Patient will need to follow up with primary care provider within one week of discharge, repeat CBC and BMP.  Patient should continue medications as prescribed.  Patient should follow a heart healthy diet.   Increase activity slowly   Complete by: As directed      Allergies as of 07/02/2020   No Known Allergies  Medication List    STOP taking these medications   aspirin 81 MG tablet     TAKE these medications   Centrum Silver 50+Men Tabs Take 1 tablet by mouth daily.   cyclobenzaprine 10 MG tablet Commonly known as: FLEXERIL Take 10 mg by mouth 3  (three) times daily as needed for muscle spasms.   loratadine 10 MG tablet Commonly known as: CLARITIN Take 10 mg by mouth daily.   montelukast 10 MG tablet Commonly known as: SINGULAIR Take 10 mg by mouth at bedtime.   sildenafil 100 MG tablet Commonly known as: VIAGRA Take 100 mg by mouth daily as needed for erectile dysfunction.   simvastatin 80 MG tablet Commonly known as: ZOCOR Take 80 mg by mouth daily.   tolterodine 4 MG 24 hr capsule Commonly known as: DETROL LA Take 4 mg by mouth daily.      No Known Allergies  Follow-up Information    Deatra James, MD. Schedule an appointment as soon as possible for a visit in 1 week(s).   Specialty: Family Medicine Why: Hospital follow up Contact information: [address], [address] [place] [address] [phone]                The results of significant diagnostics from this hospitalization (including imaging, microbiology, ancillary and laboratory) are listed below for reference.    Significant Diagnostic Studies: No results found.  Microbiology: Recent Results (from the past 240 hour(s))  Resp Panel by RT-PCR (Flu A&B, Covid) Nasopharyngeal Swab     Status: None   Collection Time: 06/29/20 11:41 PM   Specimen: Nasopharyngeal Swab; Nasopharyngeal(NP) swabs in vial transport medium  Result Value Ref Range Status   SARS Coronavirus 2 by RT PCR NEGATIVE NEGATIVE Final    Comment: (NOTE) SARS-CoV-2 target nucleic acids are NOT DETECTED.  The SARS-CoV-2 RNA is generally detectable in upper respiratory specimens during the acute phase of infection. The lowest concentration of SARS-CoV-2 viral copies this assay can detect is 138 copies/mL. A negative result does not preclude SARS-Cov-2 infection and should not be used as the sole basis for treatment or other patient management decisions. A negative result may occur with  improper specimen collection/handling, submission of specimen other than  nasopharyngeal swab, presence of viral mutation(s) within the areas targeted by this assay, and inadequate number of viral copies(<138 copies/mL). A negative result must be combined with clinical observations, patient history, and epidemiological information. The expected result is Negative.  Fact Sheet for Patients:  BloggerCourse.com  Fact Sheet for Healthcare Providers:  SeriousBroker.it  This test is no t yet approved or cleared by the [place] FDA and  has been authorized for detection and/or diagnosis of SARS-CoV-2 by FDA under an Emergency Use Authorization (EUA). This EUA will remain  in effect (meaning this test can be used) for the duration of the COVID-19 declaration under Section 564(b)(1) of the Act, 21 U.S.C.section 360bbb-3(b)(1), unless the authorization is terminated  or revoked sooner.       Influenza A by PCR NEGATIVE NEGATIVE Final   Influenza B by PCR NEGATIVE NEGATIVE Final    Comment: (NOTE) The Xpert Xpress SARS-CoV-2/FLU/RSV plus assay is intended as an aid in the diagnosis of influenza from Nasopharyngeal swab specimens and should not be used as a sole basis for treatment. Nasal washings and aspirates are unacceptable for Xpert Xpress SARS-CoV-2/FLU/RSV testing.  Fact Sheet for Patients: BloggerCourse.com  Fact Sheet for Healthcare Providers: SeriousBroker.it  This test is not yet approved  or cleared by the [place] and has been authorized for detection and/or diagnosis of SARS-CoV-2 by FDA under an Emergency Use Authorization (EUA). This EUA will remain in effect (meaning this test can be used) for the duration of the COVID-19 declaration under Section 564(b)(1) of the Act, 21 U.S.C. section 360bbb-3(b)(1), unless the authorization is terminated or revoked.  Performed at [facility], [address], [place], [place] [address]       Labs: Basic Metabolic Panel: Recent Labs  Lab 06/29/20 2250 06/29/20 2252 06/30/20 0112 07/01/20 0402 07/02/20 0219  NA 137 139 136 140 138  K 3.7 3.6 4.0 3.5 3.3*  CL 105 102 107 110 108  CO2 25  --  24 26 24   GLUCOSE 139* 136* 148* 98 107*  BUN 16 18 15 6 6   CREATININE 1.07 1.00 0.93 0.92 0.92  CALCIUM 8.5*  --  8.2* 8.1* 8.2*   Liver Function Tests: Recent Labs  Lab 06/29/20 2250  AST 20  ALT 26  ALKPHOS 52  BILITOT 0.6  PROT 5.8*  ALBUMIN 3.6   No results for input(s): LIPASE, AMYLASE in the last 168 hours. No results for input(s): AMMONIA in the last 168 hours. CBC: Recent Labs  Lab 06/29/20 2250 06/29/20 2252 06/30/20 0112 06/30/20 0457 07/01/20 0402 07/02/20 0219  WBC 19.0*  --  11.1*  --  6.4  --   NEUTROABS 16.0*  --   --   --   --   --   HGB 11.7* 11.6* 10.2* 9.8* 8.7* 7.9*  HCT 36.1* 34.0* 31.7* 30.8* 25.8* 24.5*  MCV 96.0  --  95.8  --  95.2  --   PLT 256  --  226  --  176  --    Cardiac Enzymes: No results for input(s): CKTOTAL, CKMB, CKMBINDEX, TROPONINI in the last 168 hours. BNP: BNP (last 3 results) No results for input(s): BNP in the last 8760 hours.  ProBNP (last 3 results) No results for input(s): PROBNP in the last 8760 hours.  CBG: No results for input(s): GLUCAP in the last 168 hours.     Signed:  08/31/20  [facility] 07/02/2020, 10:54 AM

## 2020-07-02 NOTE — Plan of Care (Signed)
  Problem: Education: Goal: Ability to identify signs and symptoms of gastrointestinal bleeding will improve Outcome: Adequate for Discharge   Problem: Bowel/Gastric: Goal: Will show no signs and symptoms of gastrointestinal bleeding Outcome: Adequate for Discharge   Problem: Fluid Volume: Goal: Will show no signs and symptoms of excessive bleeding Outcome: Adequate for Discharge   Problem: Clinical Measurements: Goal: Complications related to the disease process, condition or treatment will be avoided or minimized Outcome: Adequate for Discharge   Problem: Education: Goal: Knowledge of General Education information will improve Description: Including pain rating scale, medication(s)/side effects and non-pharmacologic comfort measures Outcome: Adequate for Discharge   Problem: Health Behavior/Discharge Planning: Goal: Ability to manage health-related needs will improve Outcome: Adequate for Discharge   Problem: Clinical Measurements: Goal: Ability to maintain clinical measurements within normal limits will improve Outcome: Adequate for Discharge Goal: Will remain free from infection Outcome: Adequate for Discharge Goal: Diagnostic test results will improve Outcome: Adequate for Discharge Goal: Respiratory complications will improve Outcome: Adequate for Discharge Goal: Cardiovascular complication will be avoided Outcome: Adequate for Discharge   Problem: Activity: Goal: Risk for activity intolerance will decrease Outcome: Adequate for Discharge   Problem: Nutrition: Goal: Adequate nutrition will be maintained Outcome: Adequate for Discharge   Problem: Coping: Goal: Level of anxiety will decrease Outcome: Adequate for Discharge   Problem: Elimination: Goal: Will not experience complications related to bowel motility Outcome: Adequate for Discharge Goal: Will not experience complications related to urinary retention Outcome: Adequate for Discharge   Problem: Pain  Managment: Goal: General experience of comfort will improve Outcome: Adequate for Discharge   Problem: Safety: Goal: Ability to remain free from injury will improve Outcome: Adequate for Discharge   Problem: Skin Integrity: Goal: Risk for impaired skin integrity will decrease Outcome: Adequate for Discharge   

## 2020-07-02 NOTE — Plan of Care (Signed)
Problem: Education: Goal: Ability to identify signs and symptoms of gastrointestinal bleeding will improve 07/02/2020 1101 by Kennedy Bucker, RN Outcome: Adequate for Discharge 07/02/2020 1101 by Kennedy Bucker, RN Outcome: Adequate for Discharge   Problem: Bowel/Gastric: Goal: Will show no signs and symptoms of gastrointestinal bleeding 07/02/2020 1101 by Kennedy Bucker, RN Outcome: Adequate for Discharge 07/02/2020 1101 by Kennedy Bucker, RN Outcome: Adequate for Discharge   Problem: Fluid Volume: Goal: Will show no signs and symptoms of excessive bleeding 07/02/2020 1101 by Kennedy Bucker, RN Outcome: Adequate for Discharge 07/02/2020 1101 by Kennedy Bucker, RN Outcome: Adequate for Discharge   Problem: Clinical Measurements: Goal: Complications related to the disease process, condition or treatment will be avoided or minimized 07/02/2020 1101 by Kennedy Bucker, RN Outcome: Adequate for Discharge 07/02/2020 1101 by Kennedy Bucker, RN Outcome: Adequate for Discharge   Problem: Education: Goal: Knowledge of General Education information will improve Description: Including pain rating scale, medication(s)/side effects and non-pharmacologic comfort measures 07/02/2020 1101 by Kennedy Bucker, RN Outcome: Adequate for Discharge 07/02/2020 1101 by Kennedy Bucker, RN Outcome: Adequate for Discharge   Problem: Health Behavior/Discharge Planning: Goal: Ability to manage health-related needs will improve 07/02/2020 1101 by Kennedy Bucker, RN Outcome: Adequate for Discharge 07/02/2020 1101 by Kennedy Bucker, RN Outcome: Adequate for Discharge   Problem: Clinical Measurements: Goal: Ability to maintain clinical measurements within normal limits will improve 07/02/2020 1101 by Kennedy Bucker, RN Outcome: Adequate for Discharge 07/02/2020 1101 by Kennedy Bucker, RN Outcome: Adequate for Discharge Goal: Will remain free from infection 07/02/2020 1101  by Kennedy Bucker, RN Outcome: Adequate for Discharge 07/02/2020 1101 by Kennedy Bucker, RN Outcome: Adequate for Discharge Goal: Diagnostic test results will improve 07/02/2020 1101 by Kennedy Bucker, RN Outcome: Adequate for Discharge 07/02/2020 1101 by Kennedy Bucker, RN Outcome: Adequate for Discharge Goal: Respiratory complications will improve 07/02/2020 1101 by Kennedy Bucker, RN Outcome: Adequate for Discharge 07/02/2020 1101 by Kennedy Bucker, RN Outcome: Adequate for Discharge Goal: Cardiovascular complication will be avoided 07/02/2020 1101 by Kennedy Bucker, RN Outcome: Adequate for Discharge 07/02/2020 1101 by Kennedy Bucker, RN Outcome: Adequate for Discharge   Problem: Activity: Goal: Risk for activity intolerance will decrease 07/02/2020 1101 by Kennedy Bucker, RN Outcome: Adequate for Discharge 07/02/2020 1101 by Kennedy Bucker, RN Outcome: Adequate for Discharge   Problem: Nutrition: Goal: Adequate nutrition will be maintained 07/02/2020 1101 by Kennedy Bucker, RN Outcome: Adequate for Discharge 07/02/2020 1101 by Kennedy Bucker, RN Outcome: Adequate for Discharge   Problem: Coping: Goal: Level of anxiety will decrease 07/02/2020 1101 by Kennedy Bucker, RN Outcome: Adequate for Discharge 07/02/2020 1101 by Kennedy Bucker, RN Outcome: Adequate for Discharge   Problem: Elimination: Goal: Will not experience complications related to bowel motility 07/02/2020 1101 by Kennedy Bucker, RN Outcome: Adequate for Discharge 07/02/2020 1101 by Kennedy Bucker, RN Outcome: Adequate for Discharge Goal: Will not experience complications related to urinary retention 07/02/2020 1101 by Kennedy Bucker, RN Outcome: Adequate for Discharge 07/02/2020 1101 by Kennedy Bucker, RN Outcome: Adequate for Discharge   Problem: Pain Managment: Goal: General experience of comfort will improve 07/02/2020 1101 by Kennedy Bucker,  RN Outcome: Adequate for Discharge 07/02/2020 1101 by Kennedy Bucker, RN Outcome: Adequate for Discharge   Problem: Safety: Goal: Ability to remain free from injury will improve 07/02/2020 1101 by Kennedy Bucker, RN Outcome: Adequate for Discharge 07/02/2020 1101 by Kennedy Bucker, RN Outcome: Adequate for Discharge   Problem: Skin Integrity: Goal: Risk for impaired skin integrity will decrease 07/02/2020 1101 by Kennedy Bucker, RN Outcome: Adequate  for Discharge 07/02/2020 1101 by Kennedy Bucker, RN Outcome: Adequate for Discharge

## 2020-07-03 ENCOUNTER — Encounter (HOSPITAL_COMMUNITY): Payer: Self-pay | Admitting: Gastroenterology

## 2020-07-04 ENCOUNTER — Emergency Department (HOSPITAL_COMMUNITY)
Admission: EM | Admit: 2020-07-04 | Discharge: 2020-07-04 | Disposition: A | Discharge status: Home / Self Care | Payer: 59 | Attending: Emergency Medicine | Admitting: Emergency Medicine

## 2020-07-04 ENCOUNTER — Other Ambulatory Visit: Payer: Self-pay

## 2020-07-04 DIAGNOSIS — K922 Gastrointestinal hemorrhage, unspecified: Secondary | ICD-10-CM | POA: Diagnosis not present

## 2020-07-04 DIAGNOSIS — K625 Hemorrhage of anus and rectum: Secondary | ICD-10-CM | POA: Diagnosis present

## 2020-07-04 LAB — CBC
HCT: 30.8 % — ABNORMAL LOW (ref 39.0–52.0)
Hemoglobin: 10 g/dL — ABNORMAL LOW (ref 13.0–17.0)
MCH: 31.1 pg (ref 26.0–34.0)
MCHC: 32.5 g/dL (ref 30.0–36.0)
MCV: 95.7 fL (ref 80.0–100.0)
Platelets: 285 10*3/uL (ref 150–400)
RBC: 3.22 MIL/uL — ABNORMAL LOW (ref 4.22–5.81)
RDW: 13.4 % (ref 11.5–15.5)
WBC: 6.7 10*3/uL (ref 4.0–10.5)
nRBC: 0.4 % — ABNORMAL HIGH (ref 0.0–0.2)

## 2020-07-04 LAB — I-STAT CHEM 8, ED
BUN: 15 mg/dL (ref 6–20)
Calcium, Ion: 1.2 mmol/L (ref 1.15–1.40)
Chloride: 103 mmol/L (ref 98–111)
Creatinine, Ser: 1 mg/dL (ref 0.61–1.24)
Glucose, Bld: 105 mg/dL — ABNORMAL HIGH (ref 70–99)
HCT: 26 % — ABNORMAL LOW (ref 39.0–52.0)
Hemoglobin: 8.8 g/dL — ABNORMAL LOW (ref 13.0–17.0)
Potassium: 3.6 mmol/L (ref 3.5–5.1)
Sodium: 141 mmol/L (ref 135–145)
TCO2: 25 mmol/L (ref 22–32)

## 2020-07-04 LAB — TYPE AND SCREEN
ABO/RH(D): A POS
Antibody Screen: NEGATIVE

## 2020-07-04 LAB — COMPREHENSIVE METABOLIC PANEL
ALT: 28 U/L (ref 0–44)
AST: 30 U/L (ref 15–41)
Albumin: 3.6 g/dL (ref 3.5–5.0)
Alkaline Phosphatase: 66 U/L (ref 38–126)
Anion gap: 9 (ref 5–15)
BUN: 13 mg/dL (ref 6–20)
CO2: 27 mmol/L (ref 22–32)
Calcium: 9.3 mg/dL (ref 8.9–10.3)
Chloride: 104 mmol/L (ref 98–111)
Creatinine, Ser: 1.19 mg/dL (ref 0.61–1.24)
GFR, Estimated: >60 mL/min (ref >60)
Glucose, Bld: 120 mg/dL — ABNORMAL HIGH (ref 70–99)
Potassium: 3.8 mmol/L (ref 3.5–5.1)
Sodium: 140 mmol/L (ref 135–145)
Total Bilirubin: 0.5 mg/dL (ref 0.3–1.2)
Total Protein: 6.2 g/dL — ABNORMAL LOW (ref 6.5–8.1)

## 2020-07-04 MED ORDER — SODIUM CHLORIDE 0.9 % IV BOLUS
500.0000 mL | Freq: Once | INTRAVENOUS | Status: AC
  Administered 2020-07-04: 500 mL via INTRAVENOUS

## 2020-07-04 NOTE — ED Provider Notes (Signed)
[facility] EMERGENCY DEPARTMENT Provider Note   CSN: [id number] Arrival date & time: 07/04/20  0025     History No chief complaint on file.   Hector Douglas is a 60 y.o. male.  Patient presents to the emergency department with concerns over a single episode of rectal bleeding that occurred this evening.  Patient was just discharged from the hospital after having a lower GI bleed.  Patient not experiencing any abdominal pain, nausea, vomiting, weakness or dizziness.        Past Medical History:  Diagnosis Date  . Hyperlipidemia     Patient Active Problem List   Diagnosis Date Noted  . GI bleed 06/30/2020  . ABLA (acute blood loss anemia) 06/30/2020  . HLD (hyperlipidemia) 06/30/2020  . Umbilical hernia 08/06/2013    Past Surgical History:  Procedure Laterality Date  . APPENDECTOMY     1977  . COLONOSCOPY WITH PROPOFOL N/A 07/01/2020   Procedure: COLONOSCOPY WITH PROPOFOL;  Surgeon: Jeani Hawking, MD;  Location: [facility] ENDOSCOPY;  Service: Endoscopy;  Laterality: N/A;  . ESOPHAGOGASTRODUODENOSCOPY (EGD) WITH PROPOFOL N/A 07/01/2020   Procedure: ESOPHAGOGASTRODUODENOSCOPY (EGD) WITH PROPOFOL;  Surgeon: Jeani Hawking, MD;  Location: [facility] ENDOSCOPY;  Service: Endoscopy;  Laterality: N/A;  . POLYPECTOMY  07/01/2020   Procedure: POLYPECTOMY;  Surgeon: Jeani Hawking, MD;  Location: [facility] ENDOSCOPY;  Service: Endoscopy;;       Family History  Problem Relation Age of Onset  . Cancer Mother 38       breast    Social History   Tobacco Use  . Smoking status: Never Smoker  . Smokeless tobacco: Never Used  Substance Use Topics  . Alcohol use: Yes    Comment: occ  . Drug use: No    Home Medications Prior to Admission medications   Medication Sig Start Date End Date Taking? Authorizing Provider  cyclobenzaprine (FLEXERIL) 10 MG tablet Take 10 mg by mouth 3 (three) times daily as needed for muscle spasms.    [provider]  loratadine (CLARITIN) 10 MG  tablet Take 10 mg by mouth daily.    [provider]  montelukast (SINGULAIR) 10 MG tablet Take 10 mg by mouth at bedtime. 05/16/20   [provider]  Multiple Vitamins-Minerals (CENTRUM SILVER 50+MEN) TABS Take 1 tablet by mouth daily.    [provider]  sildenafil (VIAGRA) 100 MG tablet Take 100 mg by mouth daily as needed for erectile dysfunction.    [provider]  simvastatin (ZOCOR) 80 MG tablet Take 80 mg by mouth daily. 06/18/20   [provider]  tolterodine (DETROL LA) 4 MG 24 hr capsule Take 4 mg by mouth daily. 06/18/20   [provider]    Allergies    Patient has no known allergies.  Review of Systems   Review of Systems  Gastrointestinal: Positive for blood in stool.  All other systems reviewed and are negative.   Physical Exam Updated Vital Signs BP 114/89   Pulse (!) 59   Temp 98 F (36.7 C) (Oral)   Resp (!) 21   Ht 5\' 7"  (1.702 m)   Wt 90.7 kg   SpO2 98%   BMI 31.32 kg/m   Physical Exam Vitals and nursing note reviewed.  Constitutional:      General: He is not in acute distress.    Appearance: Normal appearance. He is well-developed.  HENT:     Head: Normocephalic and atraumatic.     Right Ear: Hearing normal.  Left Ear: Hearing normal.     Nose: Nose normal.  Eyes:     Conjunctiva/sclera: Conjunctivae normal.     Pupils: Pupils are equal, round, and reactive to light.  Cardiovascular:     Rate and Rhythm: Regular rhythm.     Heart sounds: S1 normal and S2 normal. No murmur heard. No friction rub. No gallop.   Pulmonary:     Effort: Pulmonary effort is normal. No respiratory distress.     Breath sounds: Normal breath sounds.  Chest:     Chest wall: No tenderness.  Abdominal:     General: Bowel sounds are normal.     Palpations: Abdomen is soft.     Tenderness: There is no abdominal tenderness. There is no guarding or rebound. Negative signs include Murphy's sign and McBurney's sign.      Hernia: No hernia is present.  Musculoskeletal:        General: Normal range of motion.     Cervical back: Normal range of motion and neck supple.  Skin:    General: Skin is warm and dry.     Findings: No rash.  Neurological:     Mental Status: He is alert and oriented to person, place, and time.     GCS: GCS eye subscore is 4. GCS verbal subscore is 5. GCS motor subscore is 6.     Cranial Nerves: No cranial nerve deficit.     Sensory: No sensory deficit.     Coordination: Coordination normal.  Psychiatric:        Speech: Speech normal.        Behavior: Behavior normal.        Thought Content: Thought content normal.     ED Results / Procedures / Treatments   Labs (all labs ordered are listed, but only abnormal results are displayed) Labs Reviewed  COMPREHENSIVE METABOLIC PANEL - Abnormal; Notable for the following components:      Result Value   Glucose, Bld 120 (*)    Total Protein 6.2 (*)    All other components within normal limits  CBC - Abnormal; Notable for the following components:   RBC 3.22 (*)    Hemoglobin 10.0 (*)    HCT 30.8 (*)    nRBC 0.4 (*)    All other components within normal limits  POC OCCULT BLOOD, ED  TYPE AND SCREEN    EKG None  Radiology No results found.  Procedures Procedures   Medications Ordered in ED Medications  sodium chloride 0.9 % bolus 500 mL (500 mLs Intravenous New Bag/Given 07/04/20 0448)    ED Course  I have reviewed the triage vital signs and the nursing notes.  Pertinent labs & imaging results that were available during my care of the patient were reviewed by me and considered in my medical decision making (see chart for details).    MDM Rules/Calculators/A&P                          Patient presents with concerns over rectal bleeding.  Patient was recently hospitalized and worked up for GI bleed.  Upper endoscopy was normal.  Colonoscopy showed evidence of diverticulosis with active bleeding.  Patient had a single  episode prior to coming to the ER, has been here a number of hours without any further bleeding.  Vital signs are stable.  Hemoglobin was 10 at arrival.  Patient received IV fluids and was monitored through the night.  Repeat  hemoglobin is 8.8, not far off from where he was during his hospital stay.  As he has not had any further bleeding here I do not feel he requires hospitalization or intervention.  Patient reassured, no further work-up needed at this time, return for worsening bleeding.  Final Clinical Impression(s) / ED Diagnoses Final diagnoses:  Lower GI bleed    Rx / DC Orders ED Discharge Orders    None       Samar Dass, Canary Brim, MD 07/04/20 [phone]

## 2020-07-04 NOTE — ED Triage Notes (Signed)
Pt came in c/o of GI bleed. Pt was recently d/c from [facility] yesterday around 1300. Pt states he has had only 1 episode of GI bleeding that happened around 2200.

## 2020-07-05 LAB — SURGICAL PATHOLOGY

## 2021-10-25 ENCOUNTER — Emergency Department (HOSPITAL_BASED_OUTPATIENT_CLINIC_OR_DEPARTMENT_OTHER)
Admission: EM | Admit: 2021-10-25 | Discharge: 2021-10-25 | Disposition: A | Discharge status: Home / Self Care | Payer: 59 | Attending: Emergency Medicine | Admitting: Emergency Medicine

## 2021-10-25 ENCOUNTER — Encounter (HOSPITAL_BASED_OUTPATIENT_CLINIC_OR_DEPARTMENT_OTHER): Payer: Self-pay | Admitting: Pediatrics

## 2021-10-25 ENCOUNTER — Other Ambulatory Visit: Payer: Self-pay

## 2021-10-25 ENCOUNTER — Emergency Department (HOSPITAL_BASED_OUTPATIENT_CLINIC_OR_DEPARTMENT_OTHER): Payer: 59

## 2021-10-25 DIAGNOSIS — F410 Panic disorder [episodic paroxysmal anxiety] without agoraphobia: Secondary | ICD-10-CM | POA: Diagnosis not present

## 2021-10-25 DIAGNOSIS — R630 Anorexia: Secondary | ICD-10-CM | POA: Diagnosis not present

## 2021-10-25 DIAGNOSIS — R0789 Other chest pain: Secondary | ICD-10-CM | POA: Diagnosis present

## 2021-10-25 DIAGNOSIS — F419 Anxiety disorder, unspecified: Secondary | ICD-10-CM | POA: Diagnosis not present

## 2021-10-25 LAB — CBC
HCT: 45.4 % (ref 39.0–52.0)
Hemoglobin: 15.1 g/dL (ref 13.0–17.0)
MCH: 30.9 pg (ref 26.0–34.0)
MCHC: 33.3 g/dL (ref 30.0–36.0)
MCV: 92.8 fL (ref 80.0–100.0)
Platelets: 269 10*3/uL (ref 150–400)
RBC: 4.89 MIL/uL (ref 4.22–5.81)
RDW: 13.2 % (ref 11.5–15.5)
WBC: 6.9 10*3/uL (ref 4.0–10.5)
nRBC: 0 % (ref 0.0–0.2)

## 2021-10-25 LAB — BASIC METABOLIC PANEL
Anion gap: 8 (ref 5–15)
BUN: 13 mg/dL (ref 8–23)
CO2: 22 mmol/L (ref 22–32)
Calcium: 9.1 mg/dL (ref 8.9–10.3)
Chloride: 107 mmol/L (ref 98–111)
Creatinine, Ser: 0.94 mg/dL (ref 0.61–1.24)
GFR, Estimated: >60 mL/min (ref >60)
Glucose, Bld: 132 mg/dL — ABNORMAL HIGH (ref 70–99)
Potassium: 3.9 mmol/L (ref 3.5–5.1)
Sodium: 137 mmol/L (ref 135–145)

## 2021-10-25 LAB — TROPONIN I (HIGH SENSITIVITY)
Troponin I (High Sensitivity): 3 ng/L (ref <18)
Troponin I (High Sensitivity): 3 ng/L (ref <18)

## 2021-10-25 MED ORDER — LORAZEPAM 1 MG PO TABS
1.0000 mg | ORAL_TABLET | Freq: Once | ORAL | Status: AC
  Administered 2021-10-25: 1 mg via ORAL
  Filled 2021-10-25: qty 1

## 2021-10-25 NOTE — Discharge Instructions (Addendum)
Your EKG, Chest XR, and labs were reassuring. Symptoms are very consistent with a panic attack

## 2021-10-25 NOTE — ED Provider Notes (Signed)
[facility] EMERGENCY DEPARTMENT Provider Note   CSN: [id number] Arrival date & time: 10/25/21  1035     History  Chief Complaint  Patient presents with   Chest Pain   Anxiety    Sherrick Araki is a 61 y.o. male.   Chest Pain Associated symptoms: anxiety and palpitations   Anxiety    61 year old male with medical history significant for anxiety, HLD who presents to the emergency department with a chief complaint of panic attack.  The patient states that he was sitting at work and started anxious sensation with a chief complaint of palpitations and chest tightness.  He endorsed mild diaphoresis as well.  Symptoms lasted for around 20 minutes and have since resolved.  He endorses no aggravating or alleviating factors.  Home Medications Prior to Admission medications   Medication Sig Start Date End Date Taking? Authorizing Provider  cyclobenzaprine (FLEXERIL) 10 MG tablet Take 10 mg by mouth 3 (three) times daily as needed for muscle spasms.    [provider]  FLUoxetine (PROZAC) 20 MG capsule Take 20 mg by mouth daily. 06/24/21   [provider]  loratadine (CLARITIN) 10 MG tablet Take 10 mg by mouth daily.    [provider]  montelukast (SINGULAIR) 10 MG tablet Take 10 mg by mouth at bedtime. 05/16/20   [provider]  Multiple Vitamins-Minerals (CENTRUM SILVER 50+MEN) TABS Take 1 tablet by mouth daily.    [provider]  sildenafil (VIAGRA) 100 MG tablet Take 100 mg by mouth daily as needed for erectile dysfunction.    [provider]  simvastatin (ZOCOR) 80 MG tablet Take 80 mg by mouth daily. 06/18/20   [provider]  tolterodine (DETROL LA) 4 MG 24 hr capsule Take 4 mg by mouth daily. 06/18/20   [provider]      Allergies    Patient has no known allergies.    Review of Systems   Review of Systems  Respiratory:  Positive for chest tightness.   Cardiovascular:  Positive for  palpitations.  All other systems reviewed and are negative.   Physical Exam Updated Vital Signs BP (!) 135/102   Pulse 75   Temp 98 F (36.7 C) (Oral)   Resp 18   Ht 5\' 6"  (1.676 m)   Wt 88.5 kg   SpO2 98%   BMI 31.47 kg/m  Physical Exam Vitals and nursing note reviewed.  Constitutional:      General: He is not in acute distress.    Appearance: He is well-developed.  HENT:     Head: Normocephalic and atraumatic.  Eyes:     Conjunctiva/sclera: Conjunctivae normal.  Cardiovascular:     Rate and Rhythm: Normal rate and regular rhythm.     Heart sounds: No murmur heard. Pulmonary:     Effort: Pulmonary effort is normal. No respiratory distress.     Breath sounds: Normal breath sounds.  Abdominal:     Palpations: Abdomen is soft.     Tenderness: There is no abdominal tenderness.  Musculoskeletal:        General: No swelling.     Cervical back: Neck supple.  Skin:    General: Skin is warm and dry.     Capillary Refill: Capillary refill takes less than 2 seconds.  Neurological:     Mental Status: He is alert.  Psychiatric:        Mood and Affect: Mood is anxious.        Speech: Speech  normal.        Behavior: Behavior normal. Behavior is cooperative.        Thought Content: Thought content normal.     ED Results / Procedures / Treatments   Labs (all labs ordered are listed, but only abnormal results are displayed) Labs Reviewed  BASIC METABOLIC PANEL - Abnormal; Notable for the following components:      Result Value   Glucose, Bld 132 (*)    All other components within normal limits  CBC  TROPONIN I (HIGH SENSITIVITY)  TROPONIN I (HIGH SENSITIVITY)    EKG EKG Interpretation  Date/Time:  Wednesday October 25 2021 10:50:27 EDT Ventricular Rate:  79 PR Interval:  130 QRS Duration: 94 QT Interval:  392 QTC Calculation: 449 R Axis:   96 Text Interpretation: Normal sinus rhythm Rightward axis Borderline ECG When compared with ECG of 29-Jun-2020 22:26,  PREVIOUS ECG IS PRESENT Confirmed by Ernie Avena ([id number]) on 10/25/2021 12:02:36 PM  Radiology No results found.  Procedures Procedures    Medications Ordered in ED Medications  LORazepam (ATIVAN) tablet 1 mg (1 mg Oral Given 10/25/21 1511)    ED Course/ Medical Decision Making/ A&P                           Medical Decision Making Amount and/or Complexity of Data Reviewed Labs: ordered. Radiology: ordered.  Risk Prescription drug management.   61 year old male with medical history significant for anxiety, HLD who presents to the emergency department with a chief complaint of panic attack.  The patient states that he was sitting at work and started anxious sensation with a chief complaint of palpitations and chest tightness.  He endorsed mild diaphoresis as well.  Symptoms lasted for around 20 minutes and have since resolved.  He endorses no aggravating or alleviating factors.  On arrival, the patient was vitally stable.  Symptoms are consistent with potential panic attack.  Additional differential diagnosis includes ACS, stable angina, cardiac arrhythmia.  EKG was performed and revealed ventricular rate 79, sinus rhythm, rightward axis, no ischemic changes noted.  The patient was administered an Ativan tablet.  He states that he is symptomatically improved.  Chest x-ray revealed no acute cardiac or pulm abnormality.  Troponins x2 were collected and negative, CBC without a leukocytosis or anemia, BMP without electrolyte abnormality.  Low concern for ACS at this time.  No significant EKG abnormalities.  Patient symptomatically improved and has had no recurrence of symptoms.  Overall stable for discharge and outpatient management.  Final Clinical Impression(s) / ED Diagnoses Final diagnoses:  Anxiety  Chest pressure  Panic attack    Rx / DC Orders ED Discharge Orders     None         Ernie Avena, MD 10/30/21 [phone]

## 2021-10-25 NOTE — ED Triage Notes (Signed)
Reported was sitting at work and started having anxiety and chest pain.

## 2021-11-09 ENCOUNTER — Other Ambulatory Visit: Payer: Self-pay | Admitting: Family Medicine

## 2021-11-09 DIAGNOSIS — R2242 Localized swelling, mass and lump, left lower limb: Secondary | ICD-10-CM

## 2021-11-15 ENCOUNTER — Ambulatory Visit
Admission: RE | Admit: 2021-11-15 | Discharge: 2021-11-15 | Disposition: A | Discharge status: Home / Self Care | Payer: 59 | Source: Ambulatory Visit | Attending: Family Medicine | Admitting: Family Medicine

## 2021-11-15 DIAGNOSIS — R2242 Localized swelling, mass and lump, left lower limb: Secondary | ICD-10-CM

## 2021-12-06 ENCOUNTER — Ambulatory Visit: Payer: Self-pay | Admitting: Surgery

## 2021-12-06 NOTE — H&P (View-Only) (Signed)
History of Present Illness: Hector Douglas is a 61 y.o. male who was referred to me for evaluation of a mass in the left thigh. He first noticed a lump on the thigh about 6 months ago. It has been about the same size since then, and does not cause pain. He has not had drainage or any overlying skin changes.  He had an ultrasound on 8/16 that showed a 2.8cm hypoechoic subcutaneous mass, that was read as indeterminate.   He does not take any blood thinners.     Review of Systems: A complete review of systems was obtained from the patient.  I have reviewed this information and discussed as appropriate with the patient.  See HPI as well for other ROS.       Medical History: Past Medical History Past Medical History: Diagnosis Date  Anxiety    Arthritis    Sleep apnea        There is no problem list on file for this patient.     Past Surgical History Past Surgical History: Procedure Laterality Date  APPENDECTOMY      HERNIA REPAIR          AllergiesExpand by Default Not on File    Current Outpatient Medications on File Prior to Visit Medication Sig Dispense Refill  loratadine (CLARITIN) 10 mg tablet 1 tablet      montelukast (SINGULAIR) 10 mg tablet Take 10 mg by mouth once daily      multivitamin tablet Take 1 tablet by mouth once daily      sildenafiL (VIAGRA) 100 MG tablet TAKE ONE TABLET BY MOUTH ONE TIME DAILY AS NEEDED      simvastatin (ZOCOR) 80 MG tablet Take 80 mg by mouth every evening      tolterodine (DETROL LA) 4 MG LA capsule Take 4 mg by mouth once daily       No current facility-administered medications on file prior to visit.     Family History Family History Problem Relation Age of Onset  Skin cancer Mother    Breast cancer Mother    Hyperlipidemia (Elevated cholesterol) Father    Coronary Artery Disease (Blocked arteries around heart) Father    Diabetes Sister        Social History   Tobacco Use Smoking Status Never Smokeless Tobacco Never      Social History Social History    Socioeconomic History  Marital status: Single Tobacco Use  Smoking status: Never  Smokeless tobacco: Never Substance and Sexual Activity  Alcohol use: Yes  Drug use: Never      Objective:     Vitals:   12/06/21 1526 BP: 110/80 Pulse: (!) 112 Temp: 36.7 C (98 F) SpO2: 98% Weight: 85.4 kg (188 lb 3.2 oz) Height: 170.2 cm (5\' 7" )   Body mass index is 29.48 kg/m.   Physical Exam Vitals reviewed.  Constitutional:      General: He is not in acute distress.    Appearance: Normal appearance.  HENT:     Head: Normocephalic and atraumatic.  Eyes:     General: No scleral icterus.    Conjunctiva/sclera: Conjunctivae normal.  Cardiovascular:     Rate and Rhythm: Normal rate and regular rhythm.     Heart sounds: No murmur heard. Pulmonary:     Effort: Pulmonary effort is normal. No respiratory distress.     Breath sounds: Normal breath sounds. No wheezing.  Musculoskeletal:        General: Normal range of motion.  Comments: Subcutaneous mass on left lateral thigh, about 3cm in longest diameter, smooth, well-circumscribed and mobile. No overlying skin changes.  Skin:    General: Skin is warm and dry.  Neurological:     General: No focal deficit present.     Mental Status: He is alert and oriented to person, place, and time.  Psychiatric:        Mood and Affect: Mood normal.        Behavior: Behavior normal.        Thought Content: Thought content normal.          Labs, Imaging and Diagnostic Testing: US 11/15/21: IMPRESSION: 1. 2.8 x 1.2 x 2.0 cm hypoechoic mass in the subcutaneous tissues of the left thigh, indeterminate. This does not appear cystic. Recommend clinical correlation and follow-up.   Assessment and Plan: Diagnoses and all orders for this visit:   Subcutaneous mass       This is a 61 yo male with a subcutaneous mass on the left thigh. Exam characteristics are most consistent with a lipoma or cyst, but  ultrasound characteristics are indeterminate. Given stability over 6 months and well-circumscribed nature on exam it is likely benign. I discussed that excision will yield a definitive diagnosis. The patient agrees to proceed with surgical excision. This will be scheduled electively at an outpatient surgical center. I reviewed the details of the planned procedure and he consents to proceed. He will be contacted to schedule an elective surgery date.   [name], MD [facility] General, Hepatobiliary and Pancreatic Surgery 12/06/21 4:03 PM   

## 2021-12-06 NOTE — H&P (Signed)
History of Present Illness: Hector Douglas is a 61 y.o. male who was referred to me for evaluation of a mass in the left thigh. He first noticed a lump on the thigh about 6 months ago. It has been about the same size since then, and does not cause pain. He has not had drainage or any overlying skin changes.  He had an ultrasound on 8/16 that showed a 2.8cm hypoechoic subcutaneous mass, that was read as indeterminate.   He does not take any blood thinners.     Review of Systems: A complete review of systems was obtained from the patient.  I have reviewed this information and discussed as appropriate with the patient.  See HPI as well for other ROS.       Medical History: Past Medical History Past Medical History: Diagnosis Date  Anxiety    Arthritis    Sleep apnea        There is no problem list on file for this patient.     Past Surgical History Past Surgical History: Procedure Laterality Date  APPENDECTOMY      HERNIA REPAIR          AllergiesExpand by Default Not on File    Current Outpatient Medications on File Prior to Visit Medication Sig Dispense Refill  loratadine (CLARITIN) 10 mg tablet 1 tablet      montelukast (SINGULAIR) 10 mg tablet Take 10 mg by mouth once daily      multivitamin tablet Take 1 tablet by mouth once daily      sildenafiL (VIAGRA) 100 MG tablet TAKE ONE TABLET BY MOUTH ONE TIME DAILY AS NEEDED      simvastatin (ZOCOR) 80 MG tablet Take 80 mg by mouth every evening      tolterodine (DETROL LA) 4 MG LA capsule Take 4 mg by mouth once daily       No current facility-administered medications on file prior to visit.     Family History Family History Problem Relation Age of Onset  Skin cancer Mother    Breast cancer Mother    Hyperlipidemia (Elevated cholesterol) Father    Coronary Artery Disease (Blocked arteries around heart) Father    Diabetes Sister        Social History   Tobacco Use Smoking Status Never Smokeless Tobacco Never      Social History Social History    Socioeconomic History  Marital status: Single Tobacco Use  Smoking status: Never  Smokeless tobacco: Never Substance and Sexual Activity  Alcohol use: Yes  Drug use: Never      Objective:     Vitals:   12/06/21 1526 BP: 110/80 Pulse: (!) 112 Temp: 36.7 C (98 F) SpO2: 98% Weight: 85.4 kg (188 lb 3.2 oz) Height: 170.2 cm (5\' 7" )   Body mass index is 29.48 kg/m.   Physical Exam Vitals reviewed.  Constitutional:      General: He is not in acute distress.    Appearance: Normal appearance.  HENT:     Head: Normocephalic and atraumatic.  Eyes:     General: No scleral icterus.    Conjunctiva/sclera: Conjunctivae normal.  Cardiovascular:     Rate and Rhythm: Normal rate and regular rhythm.     Heart sounds: No murmur heard. Pulmonary:     Effort: Pulmonary effort is normal. No respiratory distress.     Breath sounds: Normal breath sounds. No wheezing.  Musculoskeletal:        General: Normal range of motion.  Comments: Subcutaneous mass on left lateral thigh, about 3cm in longest diameter, smooth, well-circumscribed and mobile. No overlying skin changes.  Skin:    General: Skin is warm and dry.  Neurological:     General: No focal deficit present.     Mental Status: He is alert and oriented to person, place, and time.  Psychiatric:        Mood and Affect: Mood normal.        Behavior: Behavior normal.        Thought Content: Thought content normal.          Labs, Imaging and Diagnostic Testing: [place] 11/15/21: IMPRESSION: 1. 2.8 x 1.2 x 2.0 cm hypoechoic mass in the subcutaneous tissues of the left thigh, indeterminate. This does not appear cystic. Recommend clinical correlation and follow-up.   Assessment and Plan: Diagnoses and all orders for this visit:   Subcutaneous mass       This is a 61 yo male with a subcutaneous mass on the left thigh. Exam characteristics are most consistent with a lipoma or cyst, but  ultrasound characteristics are indeterminate. Given stability over 6 months and well-circumscribed nature on exam it is likely benign. I discussed that excision will yield a definitive diagnosis. The patient agrees to proceed with surgical excision. This will be scheduled electively at an outpatient surgical center. I reviewed the details of the planned procedure and he consents to proceed. He will be contacted to schedule an elective surgery date.   Sophronia Simas, MD [facility] General, Hepatobiliary and Pancreatic Surgery 12/06/21 4:03 PM

## 2021-12-18 ENCOUNTER — Encounter: Payer: Self-pay | Admitting: *Deleted

## 2021-12-19 ENCOUNTER — Ambulatory Visit: Payer: 59 | Admitting: Neurology

## 2021-12-19 ENCOUNTER — Encounter: Payer: Self-pay | Admitting: Neurology

## 2021-12-19 VITALS — BP 143/97 | HR 77 | Ht 67.0 in | Wt 194.0 lb

## 2021-12-19 DIAGNOSIS — R413 Other amnesia: Secondary | ICD-10-CM

## 2021-12-19 DIAGNOSIS — Z9181 History of falling: Secondary | ICD-10-CM | POA: Diagnosis not present

## 2021-12-19 DIAGNOSIS — R251 Tremor, unspecified: Secondary | ICD-10-CM

## 2021-12-19 DIAGNOSIS — G20 Parkinson's disease: Secondary | ICD-10-CM
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
## 2021-12-19 NOTE — Patient Instructions (Signed)
It was nice to meet you today. I think you have signs and symptoms of Parkinson's disease, with more right sided findings. As explained, there are medication treatment options, and we will pick up our discussion about your options soon, probably at the next appointment.  This disease does progress with time. It can affect your balance, your memory, your mood, your bowel and bladder function, your posture, fine motor skills, balance and walking and your activities of daily living. Not everyone progressive at the same rate and some patients can be quite stable for years. There are good supportive treatments and symptomatic medications available, so most patients have a very good change to a good quality of life; life expectancy is not typically altered very much either. Overall you are doing fairly healthwise, but I do want to suggest a few things today:  Remember to drink plenty of fluid at least 6 glasses (8 oz each), eat healthy meals and do not skip any meals. Try to eat protein with a every meal and eat a healthy snack such as fruit or nuts in between meals. Try to keep a regular sleep-wake schedule and try to exercise daily, particularly in the form of walking, 20-30 minutes a day, if you can.   Please address any residual anxiety issues with your PCP/primary care.  Try to stay active physically and mentally. Engage in social activities in your community and with your family and try to keep up with current events by reading the newspaper or watching the news. Try to do word puzzles and you may like to do puzzles and brain games on the computer such as on [email or web address]/.   As far as diagnostic testing: I would like to proceed with a DaT scan: This is a specialized brain scan designed to help with diagnosis of tremor disorders. A radioactive marker gets injected and the uptake is measured in the brain and compared to normal controls and right side is compared to the left, a change in uptake can help with  diagnosis of certain tremor disorders. A brain MRI on the other hand is a brain scan that helps look at the brain structure in more detail overall and look for age-related changes, blood vessel related changes and look for stroke and volume loss which we call atrophy.   We may also pursue a brain MRI in the near future.   I would like to see you back after the DaT scan.   Our phone number is [phone]. We also have an after hours call service for urgent matters and there is a physician on-call for urgent questions, that cannot wait till the next work day. For any emergencies you know to call 911 or go to the nearest emergency room.   You can email me through [facility] and also leave a phone message for our nurses.

## 2021-12-26 ENCOUNTER — Encounter (HOSPITAL_BASED_OUTPATIENT_CLINIC_OR_DEPARTMENT_OTHER): Payer: Self-pay | Admitting: Surgery

## 2021-12-26 NOTE — Progress Notes (Signed)
Spoke w/ via phone for pre-op interview--- pt Lab needs dos----   no            Lab results------ no COVID test -----patient states asymptomatic no test needed Arrive at ------- 0845 on 01-04-2022 NPO after MN NO Solid Food.  Clear liquids from MN until--- 0745 Med rec completed Medications to take morning of surgery ----- claritin, zocor, detrol Diabetic medication ----- n/a Patient instructed no nail polish to be worn day of surgery Patient instructed to bring photo id and insurance card day of surgery Patient aware to have Driver (ride ) / caregiver  for 24 hours after surgery -- friend, amy Patient Special Instructions ----- n/a Pre-Op special Istructions ----- n/a Patient verbalized understanding of instructions that were given at this phone interview. Patient denies shortness of breath, chest pain, fever, cough at this phone interview.

## 2021-12-28 ENCOUNTER — Telehealth: Payer: Self-pay | Admitting: Neurology

## 2021-12-28 NOTE — Telephone Encounter (Signed)
[place]: [id number] exp. 12/28/21-02/11/22 sent to [facility] nuclear medicine [phone]

## 2022-01-03 ENCOUNTER — Encounter: Payer: Self-pay | Admitting: Neurology

## 2022-01-03 NOTE — Telephone Encounter (Signed)
Please advise patient to request referral for second opinion from PCP.

## 2022-01-04 ENCOUNTER — Encounter (HOSPITAL_BASED_OUTPATIENT_CLINIC_OR_DEPARTMENT_OTHER): Payer: Self-pay | Admitting: Surgery

## 2022-01-04 ENCOUNTER — Encounter (HOSPITAL_BASED_OUTPATIENT_CLINIC_OR_DEPARTMENT_OTHER)
Admission: RE | Disposition: A | Discharge status: Home / Self Care | Payer: Self-pay | Source: Home / Self Care | Attending: Surgery

## 2022-01-04 ENCOUNTER — Other Ambulatory Visit: Payer: Self-pay

## 2022-01-04 ENCOUNTER — Ambulatory Visit (HOSPITAL_BASED_OUTPATIENT_CLINIC_OR_DEPARTMENT_OTHER): Payer: 59 | Admitting: Anesthesiology

## 2022-01-04 ENCOUNTER — Ambulatory Visit (HOSPITAL_BASED_OUTPATIENT_CLINIC_OR_DEPARTMENT_OTHER)
Admission: RE | Admit: 2022-01-04 | Discharge: 2022-01-04 | Disposition: A | Discharge status: Home / Self Care | Payer: 59 | Attending: Surgery | Admitting: Surgery

## 2022-01-04 DIAGNOSIS — S7012XA Contusion of left thigh, initial encounter: Secondary | ICD-10-CM | POA: Insufficient documentation

## 2022-01-04 DIAGNOSIS — G20A1 Parkinson's disease without dyskinesia, without mention of fluctuations: Secondary | ICD-10-CM | POA: Insufficient documentation

## 2022-01-04 DIAGNOSIS — X58XXXA Exposure to other specified factors, initial encounter: Secondary | ICD-10-CM | POA: Insufficient documentation

## 2022-01-04 DIAGNOSIS — M7989 Other specified soft tissue disorders: Secondary | ICD-10-CM | POA: Diagnosis not present

## 2022-01-04 DIAGNOSIS — G4730 Sleep apnea, unspecified: Secondary | ICD-10-CM | POA: Insufficient documentation

## 2022-01-04 DIAGNOSIS — Z01818 Encounter for other preprocedural examination: Secondary | ICD-10-CM

## 2022-01-04 DIAGNOSIS — R2242 Localized swelling, mass and lump, left lower limb: Secondary | ICD-10-CM | POA: Diagnosis present

## 2022-01-04 HISTORY — DX: Other cervical disc degeneration, unspecified cervical region: M50.30

## 2022-01-04 HISTORY — DX: Localized swelling, mass and lump, left lower limb: R22.42

## 2022-01-04 HISTORY — PX: MASS EXCISION: SHX2000

## 2022-01-04 HISTORY — DX: Diverticulosis of large intestine without perforation or abscess without bleeding: K57.30

## 2022-01-04 HISTORY — DX: Mild cognitive impairment of uncertain or unknown etiology: G31.84

## 2022-01-04 HISTORY — DX: Obstructive sleep apnea (adult) (pediatric): G47.33

## 2022-01-04 HISTORY — DX: Generalized anxiety disorder: F41.1

## 2022-01-04 HISTORY — DX: Psoriasis, unspecified: L40.9

## 2022-01-04 HISTORY — DX: Overactive bladder: N32.81

## 2022-01-04 HISTORY — DX: Personal history of other mental and behavioral disorders: Z86.59

## 2022-01-04 HISTORY — DX: Parkinsonism, unspecified: G20.C

## 2022-01-04 HISTORY — DX: Personal history of other diseases of the digestive system: Z87.19

## 2022-01-04 HISTORY — DX: Male erectile dysfunction, unspecified: N52.9

## 2022-01-04 SURGERY — EXCISION MASS
Anesthesia: General | Laterality: Left

## 2022-01-04 MED ORDER — TRAMADOL HCL 50 MG PO TABS: 50.0000 mg | ORAL_TABLET | Freq: Four times a day (QID) | ORAL | 0 refills | Status: AC | PRN

## 2022-01-04 MED ORDER — FENTANYL CITRATE (PF) 100 MCG/2ML IJ SOLN
INTRAMUSCULAR | Status: AC
  Filled 2022-01-04: qty 2

## 2022-01-04 MED ORDER — ONDANSETRON HCL 4 MG/2ML IJ SOLN
INTRAMUSCULAR | Status: DC | PRN
  Administered 2022-01-04: 4 mg via INTRAVENOUS

## 2022-01-04 MED ORDER — MIDAZOLAM HCL 2 MG/2ML IJ SOLN
INTRAMUSCULAR | Status: DC | PRN
  Administered 2022-01-04: 2 mg via INTRAVENOUS

## 2022-01-04 MED ORDER — DEXAMETHASONE SODIUM PHOSPHATE 10 MG/ML IJ SOLN
INTRAMUSCULAR | Status: AC
  Filled 2022-01-04: qty 1

## 2022-01-04 MED ORDER — PROPOFOL 10 MG/ML IV BOLUS
INTRAVENOUS | Status: AC
  Filled 2022-01-04: qty 20

## 2022-01-04 MED ORDER — ONDANSETRON HCL 4 MG/2ML IJ SOLN
INTRAMUSCULAR | Status: AC
  Filled 2022-01-04: qty 2

## 2022-01-04 MED ORDER — MIDAZOLAM HCL 2 MG/2ML IJ SOLN
INTRAMUSCULAR | Status: AC
  Filled 2022-01-04: qty 2

## 2022-01-04 MED ORDER — ACETAMINOPHEN 500 MG PO TABS
1000.0000 mg | ORAL_TABLET | ORAL | Status: AC
  Administered 2022-01-04: 1000 mg via ORAL

## 2022-01-04 MED ORDER — PHENYLEPHRINE 80 MCG/ML (10ML) SYRINGE FOR IV PUSH (FOR BLOOD PRESSURE SUPPORT)
PREFILLED_SYRINGE | INTRAVENOUS | Status: AC
  Filled 2022-01-04: qty 10

## 2022-01-04 MED ORDER — PROPOFOL 10 MG/ML IV BOLUS
INTRAVENOUS | Status: DC | PRN
  Administered 2022-01-04: 150 mg via INTRAVENOUS
  Administered 2022-01-04: 30 mg via INTRAVENOUS

## 2022-01-04 MED ORDER — EPHEDRINE SULFATE-NACL 50-0.9 MG/10ML-% IV SOSY
PREFILLED_SYRINGE | INTRAVENOUS | Status: DC | PRN
  Administered 2022-01-04: 5 mg via INTRAVENOUS
  Administered 2022-01-04: 10 mg via INTRAVENOUS

## 2022-01-04 MED ORDER — EPHEDRINE 5 MG/ML INJ
INTRAVENOUS | Status: AC
  Filled 2022-01-04: qty 5

## 2022-01-04 MED ORDER — LACTATED RINGERS IV SOLN
INTRAVENOUS | Status: DC
  Administered 2022-01-04: 1000 mL via INTRAVENOUS

## 2022-01-04 MED ORDER — CEFAZOLIN SODIUM-DEXTROSE 2-4 GM/100ML-% IV SOLN
2.0000 g | INTRAVENOUS | Status: AC
  Administered 2022-01-04: 2 g via INTRAVENOUS

## 2022-01-04 MED ORDER — 0.9 % SODIUM CHLORIDE (POUR BTL) OPTIME
TOPICAL | Status: DC | PRN
  Administered 2022-01-04: 500 mL

## 2022-01-04 MED ORDER — FENTANYL CITRATE (PF) 100 MCG/2ML IJ SOLN
INTRAMUSCULAR | Status: DC | PRN
  Administered 2022-01-04 (×2): 50 ug via INTRAVENOUS
  Administered 2022-01-04: 25 ug via INTRAVENOUS

## 2022-01-04 MED ORDER — GLYCOPYRROLATE PF 0.2 MG/ML IJ SOSY
PREFILLED_SYRINGE | INTRAMUSCULAR | Status: AC
  Filled 2022-01-04: qty 1

## 2022-01-04 MED ORDER — LIDOCAINE 2% (20 MG/ML) 5 ML SYRINGE
INTRAMUSCULAR | Status: DC | PRN
  Administered 2022-01-04: 60 mg via INTRAVENOUS

## 2022-01-04 MED ORDER — PHENYLEPHRINE 80 MCG/ML (10ML) SYRINGE FOR IV PUSH (FOR BLOOD PRESSURE SUPPORT)
PREFILLED_SYRINGE | INTRAVENOUS | Status: DC | PRN
  Administered 2022-01-04 (×4): 160 ug via INTRAVENOUS
  Administered 2022-01-04: 240 ug via INTRAVENOUS
  Administered 2022-01-04: 80 ug via INTRAVENOUS
  Administered 2022-01-04: 240 ug via INTRAVENOUS
  Administered 2022-01-04: 160 ug via INTRAVENOUS
  Administered 2022-01-04: 80 ug via INTRAVENOUS

## 2022-01-04 MED ORDER — DEXAMETHASONE SODIUM PHOSPHATE 10 MG/ML IJ SOLN
INTRAMUSCULAR | Status: DC | PRN
  Administered 2022-01-04: 10 mg via INTRAVENOUS

## 2022-01-04 MED ORDER — ACETAMINOPHEN 500 MG PO TABS
ORAL_TABLET | ORAL | Status: AC
  Filled 2022-01-04: qty 2

## 2022-01-04 MED ORDER — CEFAZOLIN SODIUM-DEXTROSE 2-4 GM/100ML-% IV SOLN
INTRAVENOUS | Status: AC
  Filled 2022-01-04: qty 100

## 2022-01-04 MED ORDER — BUPIVACAINE-EPINEPHRINE 0.25% -1:200000 IJ SOLN
INTRAMUSCULAR | Status: DC | PRN
  Administered 2022-01-04: 20 mL

## 2022-01-04 MED ORDER — FENTANYL CITRATE (PF) 100 MCG/2ML IJ SOLN: 25.0000 ug | INTRAMUSCULAR | Status: DC | PRN

## 2022-01-04 MED ORDER — ARTIFICIAL TEARS OPHTHALMIC OINT
TOPICAL_OINTMENT | OPHTHALMIC | Status: AC
  Filled 2022-01-04: qty 3.5

## 2022-01-04 MED ORDER — GLYCOPYRROLATE PF 0.2 MG/ML IJ SOSY
PREFILLED_SYRINGE | INTRAMUSCULAR | Status: DC | PRN
  Administered 2022-01-04: .2 mg via INTRAVENOUS

## 2022-01-04 SURGICAL SUPPLY — 30 items
ADH SKN CLS APL DERMABOND .7 (GAUZE/BANDAGES/DRESSINGS) ×1
APL PRP STRL LF DISP 70% ISPRP (MISCELLANEOUS) ×1
BLADE SURG 15 STRL LF DISP TIS (BLADE) ×1 IMPLANT
BLADE SURG 15 STRL SS (BLADE) ×1
CHLORAPREP W/TINT 26 (MISCELLANEOUS) IMPLANT
COVER BACK TABLE 60X90IN (DRAPES) ×1 IMPLANT
COVER MAYO STAND STRL (DRAPES) ×1 IMPLANT
DERMABOND ADVANCED .7 DNX12 (GAUZE/BANDAGES/DRESSINGS) IMPLANT
DRAPE LAPAROTOMY 100X72 PEDS (DRAPES) IMPLANT
DRAPE UTILITY XL STRL (DRAPES) ×1 IMPLANT
ELECT REM PT RETURN 9FT ADLT (ELECTROSURGICAL) ×1
ELECTRODE REM PT RTRN 9FT ADLT (ELECTROSURGICAL) ×1 IMPLANT
GAUZE SPONGE 4X4 12PLY STRL (GAUZE/BANDAGES/DRESSINGS) ×1 IMPLANT
GLOVE BIO SURGEON STRL SZ 6 (GLOVE) ×1 IMPLANT
GLOVE BIOGEL PI IND STRL 6 (GLOVE) ×1 IMPLANT
GLOVE SS PI  5.5 STRL (GLOVE) ×1
GLOVE SS PI 5.5 STRL (GLOVE) ×1 IMPLANT
GOWN STRL REUS W/TWL LRG LVL3 (GOWN DISPOSABLE) ×1 IMPLANT
KIT TURNOVER CYSTO (KITS) ×1 IMPLANT
NEEDLE HYPO 22GX1.5 SAFETY (NEEDLE) IMPLANT
PACK BASIN DAY SURGERY FS (CUSTOM PROCEDURE TRAY) ×1 IMPLANT
PENCIL SMOKE EVACUATOR (MISCELLANEOUS) ×1 IMPLANT
SUT MNCRL AB 4-0 PS2 18 (SUTURE) ×1 IMPLANT
SUT VIC AB 3-0 SH 27 (SUTURE) ×1
SUT VIC AB 3-0 SH 27X BRD (SUTURE) ×1 IMPLANT
SYR BULB IRRIG 60ML STRL (SYRINGE) ×1 IMPLANT
SYR CONTROL 10ML LL (SYRINGE) ×1 IMPLANT
TOWEL OR 17X26 10 PK STRL BLUE (TOWEL DISPOSABLE) ×1 IMPLANT
TUBE CONNECTING 12X1/4 (SUCTIONS) ×1 IMPLANT
YANKAUER SUCT BULB TIP NO VENT (SUCTIONS) ×1 IMPLANT

## 2022-01-04 NOTE — Anesthesia Preprocedure Evaluation (Addendum)
Anesthesia Evaluation  Patient identified by MRN, date of birth, ID band Patient awake    Reviewed: Allergy & Precautions, NPO status , Patient's Chart, lab work & pertinent test results  Airway Mallampati: III  TM Distance: >3 FB Neck ROM: Full    Dental no notable dental hx. (+) Teeth Intact, Dental Advisory Given   Pulmonary sleep apnea and Continuous Positive Airway Pressure Ventilation ,    Pulmonary exam normal breath sounds clear to auscultation       Cardiovascular negative cardio ROS Normal cardiovascular exam Rhythm:Regular Rate:Normal     Neuro/Psych PSYCHIATRIC DISORDERS  Neuromuscular disease (Parkinsons)    GI/Hepatic negative GI ROS, Neg liver ROS,   Endo/Other  negative endocrine ROS  Renal/GU negative Renal ROS  negative genitourinary   Musculoskeletal  (+) Arthritis ,   Abdominal   Peds  Hematology negative hematology ROS (+)   Anesthesia Other Findings   Reproductive/Obstetrics                            Anesthesia Physical Anesthesia Plan  ASA: 2  Anesthesia Plan: General   Post-op Pain Management: Tylenol PO (pre-op)*   Induction: Intravenous  PONV Risk Score and Plan: 2 and Ondansetron, Dexamethasone and Midazolam  Airway Management Planned: LMA  Additional Equipment:   Intra-op Plan:   Post-operative Plan: Extubation in OR  Informed Consent: I have reviewed the patients History and Physical, chart, labs and discussed the procedure including the risks, benefits and alternatives for the proposed anesthesia with the patient or authorized representative who has indicated his/her understanding and acceptance.     Dental advisory given  Plan Discussed with: CRNA  Anesthesia Plan Comments:         Anesthesia Quick Evaluation

## 2022-01-04 NOTE — Anesthesia Postprocedure Evaluation (Signed)
Anesthesia Post Note  Patient: Hector Douglas  Procedure(s) Performed: EXCISION OF LEFT THIGH MASS (Left)     Patient location during evaluation: PACU Anesthesia Type: General Level of consciousness: awake and alert Pain management: pain level controlled Vital Signs Assessment: post-procedure vital signs reviewed and stable Respiratory status: spontaneous breathing, nonlabored ventilation, respiratory function stable and patient connected to nasal cannula oxygen Cardiovascular status: blood pressure returned to baseline and stable Postop Assessment: no apparent nausea or vomiting Anesthetic complications: no   No notable events documented.  Last Vitals:  Vitals:   01/04/22 1245 01/04/22 1300  BP: 111/80 110/79  Pulse: 78 66  Resp: 20 (!) 25  Temp:    SpO2: 100% 92%    Last Pain:  Vitals:   01/04/22 1300  TempSrc:   PainSc: 0-No pain                 Mionna Advincula L Zakar Brosch

## 2022-01-04 NOTE — Discharge Instructions (Addendum)
[facility] DISCHARGE INSTRUCTIONS  Activity Ok to shower in 24 hours, but do not bathe or submerge incisions underwater. Do not drive while taking narcotic pain medication.  Wound Care Your incision is covered with skin glue called Dermabond. This will peel off on its own over time. You may shower and allow warm soapy water to run over your incision. Gently pat dry. Do not submerge your incision underwater. Monitor your incision for any new redness, tenderness, or drainage.  When to Call us: Fever greater than 100.5 New redness, drainage, or swelling at incision site Severe pain, nausea, or vomiting  Follow-up You have an appointment scheduled with Dr. Zenia Resides on January 24, 2022 at 9:30am. This will be at the [facility] Surgery office at [address], [place], [place], [place]. Please arrive at least 15 minutes prior to your scheduled appointment time.  For questions or concerns, please call the office at [phone].        Post Anesthesia Home Care Instructions  Activity: Get plenty of rest for the remainder of the day. A responsible individual must stay with you for 24 hours following the procedure.  For the next 24 hours, DO NOT: -Drive a car -Paediatric nurse -Drink alcoholic beverages -Take any medication unless instructed by your physician -Make any legal decisions or sign important papers.  Meals: Start with liquid foods such as gelatin or soup. Progress to regular foods as tolerated. Avoid greasy, spicy, heavy foods. If nausea and/or vomiting occur, drink only clear liquids until the nausea and/or vomiting subsides. Call your physician if vomiting continues.  Special Instructions/Symptoms: Your throat may feel dry or sore from the anesthesia or the breathing tube placed in your throat during surgery. If this causes discomfort, gargle with warm salt water. The discomfort should disappear within 24 hours.         No  acetaminophen/Tylenol until after 3:00 pm today if needed.

## 2022-01-04 NOTE — Anesthesia Procedure Notes (Signed)
Procedure Name: LMA Insertion Date/Time: 01/04/2022 11:24 AM  Performed by: Rogers Blocker, CRNAPre-anesthesia Checklist: Patient identified, Emergency Drugs available, Suction available and Patient being monitored Patient Re-evaluated:Patient Re-evaluated prior to induction Oxygen Delivery Method: Circle System Utilized Preoxygenation: Pre-oxygenation with 100% oxygen Induction Type: IV induction Ventilation: Mask ventilation without difficulty LMA: LMA inserted LMA Size: 5.0 Number of attempts: 1 Placement Confirmation: positive ETCO2 Tube secured with: Tape Dental Injury: Teeth and Oropharynx as per pre-operative assessment

## 2022-01-04 NOTE — Interval H&P Note (Signed)
History and Physical Interval Note:  01/04/2022 11:07 AM  Hector Douglas  has presented today for surgery, with the diagnosis of LEFT THIGH MASS.  The various methods of treatment have been discussed with the patient and family. After consideration of risks, benefits and other options for treatment, the patient has consented to  Procedure(s): EXCISION OF LEFT THIGH MASS (Left) as a surgical intervention.  The patient's history has been reviewed, patient examined, no change in status, stable for surgery.  I have reviewed the patient's chart and labs.  Questions were answered to the patient's satisfaction.  Plan for discharge home postoperatively.   Hector Douglas

## 2022-01-04 NOTE — Op Note (Addendum)
Date: 01/04/22  Patient: Hector Douglas MRN: [id number]  Preoperative Diagnosis: Subcutaneous mass of left thigh Postoperative Diagnosis: Cystic subcutaneous mass of left thigh with intramuscular extension  Procedure: Excision of left thigh mass  Surgeon: Michaelle Birks, MD  EBL: Minimal  Anesthesia: General  Specimens: Left thigh mass  Indications: [name] is a 61 yo male who presented with a subcutaneous mass on the left thigh, which he first noticed about 6 months prior. On exam the mass was well-circumscribed and soft. An ultrasound of the mass showed a smooth, well-circumscribed lesion of unclear etiology.  Findings: Well-circumscribed, encapsulated 3cm cystic mass on the left thigh, very adherent to the underlying muscle with an intramuscular component. Cyst contained a mixture of fluid and solid material. The intramuscular component of the wall was not able to be entirely excised but had benign features.  Procedure details: Informed consent was obtained in the preoperative area prior to the procedure. The patient was brought to the operating room and placed on the table in the supine position. General anesthesia was induced and appropriate lines and drains were placed for intraoperative monitoring. Perioperative antibiotics were administered per SCIP guidelines. The left leg was prepped and draped in the usual sterile fashion. A pre-procedure timeout was taken verifying patient identity, surgical site and procedure to be performed.  An elliptical skin incision was made over the mass, oriented along the longitudinal axis of the extremity. The subcutaneous tissue was divided and a capsule around the mass was encountered. The mass was bluntly dissected out of the subcutaneous tissue. The mass was cystic, smooth and well-circumscribed, however the deep aspect of the cyst was adherent to the muscle fascia. On dissected the cyst off the muscle, the capsule was opened. There was solid material  within the cyst. The cystic was completely taken off the fascia, and this exposed a cavity within the muscle that the cyst communicated with. This cavity was filled with turbid brown fluid and solid material. The cavity was further unroofed using cautery. The cyst wall was firm and calcified, but appeared benign. Portions of the wall were excised piecemeal, however the lining of the cyst was very densely adherent to the muscle. It appeared benign, but I did not feel the wall could be completely removed without significant damage to the surrounding muscle. Thus I left the intramuscular component of the cyst wall in place. The wound was copiously irrigated with sterile saline. Hemostasis was achieved with cautery. The deep dermis was closed with interrupted 3-0 Vicryl sutures. The skin was closed with running subcuticular 4-0 monocryl suture. Dermabond was applied.  The patient tolerated the procedure well with no apparent complications. All counts were correct x2 at the end of the procedure. The patient was extubated and taken to PACU in stable condition.  Michaelle Birks, MD 01/04/22 1:42 PM

## 2022-01-04 NOTE — Transfer of Care (Signed)
Immediate Anesthesia Transfer of Care Note  Patient: Hector Douglas  Procedure(s) Performed: EXCISION OF LEFT THIGH MASS (Left)  Patient Location: PACU  Anesthesia Type:General  Level of Consciousness: drowsy, patient cooperative and responds to stimulation  Airway & Oxygen Therapy: Patient Spontanous Breathing with face mask O2  Post-op Assessment: Report given to RN and Post -op Vital signs reviewed and stable  Post vital signs: Reviewed and stable  Last Vitals:  Vitals Value Taken Time  BP 120/81 01/04/22 1223  Temp    Pulse 91 01/04/22 1225  Resp 18 01/04/22 1225  SpO2 97 % 01/04/22 1225  Vitals shown include unvalidated device data.  Last Pain:  Vitals:   01/04/22 0830  TempSrc: Oral  PainSc: 0-No pain      Patients Stated Pain Goal: 3 (16/96/78 [id number])  Complications: No notable events documented.

## 2022-01-05 ENCOUNTER — Encounter (HOSPITAL_BASED_OUTPATIENT_CLINIC_OR_DEPARTMENT_OTHER): Payer: Self-pay | Admitting: Surgery

## 2022-01-08 ENCOUNTER — Encounter: Payer: Self-pay | Admitting: Neurology

## 2022-01-08 LAB — SURGICAL PATHOLOGY

## 2022-01-10 ENCOUNTER — Encounter (HOSPITAL_COMMUNITY)
Admission: RE | Admit: 2022-01-10 | Discharge: 2022-01-10 | Disposition: A | Discharge status: Home / Self Care | Payer: 59 | Source: Ambulatory Visit | Attending: Neurology | Admitting: Neurology

## 2022-01-10 DIAGNOSIS — G20C Parkinsonism, unspecified: Secondary | ICD-10-CM

## 2022-01-10 DIAGNOSIS — R413 Other amnesia: Secondary | ICD-10-CM | POA: Diagnosis present

## 2022-01-10 DIAGNOSIS — Z9181 History of falling: Secondary | ICD-10-CM | POA: Diagnosis present

## 2022-01-10 DIAGNOSIS — R251 Tremor, unspecified: Secondary | ICD-10-CM

## 2022-01-10 MED ORDER — POTASSIUM IODIDE (ANTIDOTE) 130 MG PO TABS: 130.0000 mg | ORAL_TABLET | Freq: Once | ORAL | Status: DC

## 2022-01-10 MED ORDER — POTASSIUM IODIDE (ANTIDOTE) 130 MG PO TABS
ORAL_TABLET | ORAL | Status: AC
  Filled 2022-01-10: qty 1

## 2022-01-10 MED ORDER — IOFLUPANE I 123 185 MBQ/2.5ML IV SOLN
4.7000 | Freq: Once | INTRAVENOUS | Status: AC | PRN
  Administered 2022-01-10: 4.7 via INTRAVENOUS
  Filled 2022-01-10: qty 5

## 2022-01-15 ENCOUNTER — Telehealth: Payer: Self-pay | Admitting: Neurology

## 2022-01-15 NOTE — Telephone Encounter (Signed)
Pt is calling. Requesting a nurse call him to go over datscan results. Pt is requesting a call back from nurse.

## 2022-01-15 NOTE — Telephone Encounter (Signed)
-----   Message from Star Age, MD sent at 01/15/2022  8:24 AM EDT ----- Please call patient (or designated party on DPR) regarding the recent nuclear medicine DaT scan result. As discussed, this is a specialized brain scan designed to aid with the diagnosis of tremor disorders, including parkinsonian disorders. A radioactive marker gets injected and the uptake is measured in the brain and compared to normal controls and right side of the brain is compared to the left side. A change in uptake can help with diagnosis of certain tremor disorders and narrow down the diagnostic possibilities. The patient's recent scan indicated abnormal (as in lower) uptake as compared to normal uptake pattern indicating an underlying parkinsonian disorder. Keep in mind, this is not a definitive test for Parkinson's disease and does not distinguish between Parkinson's disease and other, atypical parkinsonian disorders.  We had discussed a follow-up appointment after the DaTscan.  Patient can go ahead and schedule a follow-up appointment w me, or wait till he has seen Dr. Carles Collet for his second opinion.  We will leave it up to him as to when he would like to schedule a follow-up appointment.

## 2022-01-15 NOTE — Telephone Encounter (Signed)
I called patient.  I discussed his DaTscan results.  I advised him that his recent scan indicated abnormal uptake indicating an underlying parkinsonian disorder.  Patient would like to keep his appointment with Dr. Rexene Alberts on November 7.  He will also keep his second opinion with Dr. Carles Collet following that appointment.  Patient verbalized understanding of results.  Patient who questions or concerns at this time.

## 2022-01-30 ENCOUNTER — Telehealth: Payer: Self-pay | Admitting: Neurology

## 2022-01-30 NOTE — Telephone Encounter (Signed)
LVM and sent mychart msg asking pt to call back to reschedule 11/7 appointment - MD out

## 2022-02-06 ENCOUNTER — Ambulatory Visit: Payer: 59 | Admitting: Neurology

## 2022-02-08 NOTE — Progress Notes (Signed)
Assessment/Plan:   1.  Parkinsonism, likely idiopathic, but cannot rule out atypical state (but does not say yes to any of the "red flags"  -We discussed the diagnosis as well as pathophysiology of the disease.  We discussed treatment options as well as prognostic indicators.  Patient education was provided.  -We discussed that it used to be thought that levodopa would increase risk of melanoma but now it is believed that Parkinsons itself likely increases risk of melanoma. he is to get regular skin checks.  -Greater than 50% of the 60 minute visit was spent in counseling answering questions and talking about what to expect now as well as in the future.  We talked about medication options as well as potential future surgical options.  We talked about safety in the home.  -We decided to add carbidopa/levodopa 25/100.  1/2 tab tid x 1 wk, then 1/2 in am & noon & 1 at night for a week, then 1/2 in am &1 at noon &night for a week, then 1 po tid.  Risks, benefits, side effects and alternative therapies were discussed.  The opportunity to ask questions was given and they were answered to the best of my ability.  The patient expressed understanding and willingness to follow the outlined treatment protocols.  -I will refer the [facility]  -We discussed community resources in the area including patient support groups and community exercise programs for PD and pt education was provided to the patient.  -Discussed that alcohol use should be no more than 2 days/week.  Discussed that we may discontinue this altogether in the future.  -Discussed discontinuing marijuana use  -Discussed skin biopsies for alpha-synuclein, but discussed that his insurance likely would not pay for this.  -Patient had multiple questions today and I answered those to the best of my ability.  2.  Patient can follow-up here or Dr. Teofilo Pod office, whichever he would feel most comfortable  with.   Subjective:   Hector Douglas was seen today in the movement disorders clinic for neurologic consultation at the request of Deatra James, MD.  The consultation is for the evaluation of Parkinsons Disease .  Pt with sister who supplements the history.  Pt under the care of Dr. Frances Furbish.  Pt seen at [facility] on 12/19/21.   Notes reviewed.  Patient's chief complaint at that visit was memory change with decreased arm swing and micrographia.  It was felt that he likely had Parkinson's disease.  DaTscan was ordered.  DaTscan was completed on January 14, 2022.  I personally reviewed that.  There was decreased activity in the left putamen compared to that of the right.  I reviewed the scan personally and agree with the mild decrease on the left.   Specific Symptoms:  Tremor: Yes.  , bilateral UE, he is R hand dominant; he notes tremor with activation/writing Family hx of similar:  No., mother with dementia Voice: he is "slower" and slurry and "lower" Sleep: trouble staying asleep - attributes to anxiety  Vivid Dreams:  No.  Acting out dreams:  No. Wet Pillows: No. Postural symptoms:  No.  Falls?  Yes.  , 1 due to step on uneven ground (thinks anyone could have done that) Bradykinesia symptoms: shuffling gait and difficulty getting out of a chair; sister thinks slower than he used to be; seems like excess saliva but no real drooling Loss of smell:  Yes.   Loss of taste:  No. Urinary  Incontinence:  No. Difficulty Swallowing:  No. Handwriting, micrographia: Yes.   Trouble with ADL's:  No.  Trouble buttoning clothing: No. Depression:  No. But admits to anxiety last few years Memory changes:  Yes.  ; trouble with remembering his schedule for the next day Hallucinations:  No.  visual distortions: rarely N/V:  No. Lightheaded:  occ when gets up quick  Syncope: No. Diplopia:  No. Dyskinesia:  No. Prior exposure to reglan/antipsychotics: No.  Neuroimaging of the brain has not been performed in the  recent years.   ALLERGIES:  No Known Allergies  CURRENT MEDICATIONS:  Current Meds  Medication Sig   loratadine (CLARITIN) 10 MG tablet Take 10 mg by mouth daily.   montelukast (SINGULAIR) 10 MG tablet Take 10 mg by mouth at bedtime.   Multiple Vitamins-Minerals (CENTRUM SILVER 50+MEN) TABS Take 1 tablet by mouth daily.   sildenafil (VIAGRA) 100 MG tablet Take 100 mg by mouth daily as needed for erectile dysfunction.   simvastatin (ZOCOR) 80 MG tablet Take 80 mg by mouth daily.   tolterodine (DETROL LA) 4 MG 24 hr capsule Take 4 mg by mouth daily.     Objective:   VITALS:   Vitals:   02/13/22 0944  BP: (!) 155/99  Pulse: 77  SpO2: 99%  Weight: 194 lb (88 kg)  Height: 5\' 6"  (1.676 m)    GEN:  The patient appears stated age and is in NAD. HEENT:  Normocephalic, atraumatic.  The mucous membranes are moist. The superficial temporal arteries are without ropiness or tenderness. CV:  RRR Lungs:  CTAB Neck/HEME:  There are no carotid bruits bilaterally.  Neurological examination:  Orientation: The patient is alert and oriented x3.  Cranial nerves: There is good facial symmetry.  There is facial hypomimia.  Extraocular muscles are intact. The visual fields are full to confrontational testing. The speech is fluent and clear.  No trouble with guttural sounds.  Soft palate rises symmetrically and there is no tongue deviation. Hearing is intact to conversational tone. Sensation: Sensation is intact to light and pinprick throughout (facial, trunk, extremities). Vibration is intact at the bilateral big toe. There is no extinction with double simultaneous stimulation. There is no sensory dermatomal level identified. Motor: Strength is 5/5 in the bilateral upper and lower extremities.   Shoulder shrug is equal and symmetric.  There is no pronator drift. Deep tendon reflexes: Deep tendon reflexes are 2/4 at the bilateral biceps, triceps, brachioradialis, patella and achilles. Plantar responses  are downgoing bilaterally.  Movement examination: Tone: There is mod increased tone in the RUE/RLE Abnormal movements: min RUE rest tremor, only with distraction Coordination:  There is  decremation with RAM's, with any form of RAMS, including alternating supination and pronation of the forearm, hand opening and closing, finger taps, heel taps and toe taps on the R Gait and Station: The patient has no difficulty arising out of a deep-seated chair without the use of the hands. The patient's stride length is good with decreased arm swing on the R.  The patient has a neg pull test.     I have reviewed and interpreted the following labs independently   Chemistry      Component Value Date/Time   NA 137 10/25/2021 1054   K 3.9 10/25/2021 1054   CL 107 10/25/2021 1054   CO2 22 10/25/2021 1054   BUN 13 10/25/2021 1054   CREATININE 0.94 10/25/2021 1054      Component Value Date/Time   CALCIUM 9.1 10/25/2021  1054   ALKPHOS 66 07/04/2020 0138   AST 30 07/04/2020 0138   ALT 28 07/04/2020 0138   BILITOT 0.5 07/04/2020 0138      No results found for: "TSH" Lab Results  Component Value Date   WBC 6.9 10/25/2021   HGB 15.1 10/25/2021   HCT 45.4 10/25/2021   MCV 92.8 10/25/2021   PLT 269 10/25/2021     Total time spent on today's visit was 65 minutes, including both face-to-face time and nonface-to-face time.  Time included that spent on review of records (prior notes available to me/labs/imaging if pertinent), discussing treatment and goals, answering patient's questions and coordinating care.  Cc:  Deatra James, MD

## 2022-02-13 ENCOUNTER — Ambulatory Visit: Payer: 59 | Admitting: Neurology

## 2022-02-13 ENCOUNTER — Encounter: Payer: Self-pay | Admitting: Neurology

## 2022-02-13 VITALS — BP 155/99 | HR 77 | Ht 66.0 in | Wt 194.0 lb

## 2022-02-13 DIAGNOSIS — Z789 Other specified health status: Secondary | ICD-10-CM

## 2022-02-13 DIAGNOSIS — F1290 Cannabis use, unspecified, uncomplicated: Secondary | ICD-10-CM

## 2022-02-13 DIAGNOSIS — G20A1 Parkinson's disease without dyskinesia, without mention of fluctuations: Secondary | ICD-10-CM | POA: Diagnosis not present

## 2022-02-13 DIAGNOSIS — G20C Parkinsonism, unspecified: Secondary | ICD-10-CM

## 2022-02-13 MED ORDER — CARBIDOPA-LEVODOPA 25-100 MG PO TABS: 1.0000 | ORAL_TABLET | Freq: Three times a day (TID) | ORAL | 1 refills | Status: DC

## 2022-02-13 NOTE — Patient Instructions (Addendum)
Start Carbidopa Levodopa as follows: Take 1/2 tablet three times daily, at least 30 minutes before meals (approximately 7am/11am/4pm), for one week Then take 1/2 tablet in the morning, 1/2 tablet in the afternoon, 1 tablet in the evening, at least 30 minutes before meals, for one week Then take 1/2 tablet in the morning, 1 tablet in the afternoon, 1 tablet in the evening, at least 30 minutes before meals, for one week Then take 1 tablet three times daily at 7am/11am/4pm, at least 30 minutes before meals   As a reminder, carbidopa/levodopa can be taken at the same time as a carbohydrate, but we like to have you take your pill either 30 minutes before a protein source or 1 hour after as protein can interfere with carbidopa/levodopa absorption.  As we discussed, it used to be thought that levodopa would increase risk of melanoma but now it is believed that Parkinsons itself likely increases risk of melanoma. I recommend yearly skin checks with a board certified dermatologist.  You can call [facility] or [facility] for an appointment.  [facility] Address: [address], [place], [place] [address] Phone: [phone]  [facility] [place], [place], [place] Phone: [phone]  Local and Online Resources for Power over Parkinson's Group  November 2023    [place] over Parkinson's Group:    Power Over Parkinson's Patient Education Group will be Wednesday, November 8th-*Hybrid meting*- in person at [facility] location and via [facility], 2:00-3:00 pm.   Starting in November, Power over [employer] and Care Partner Groups will meet together, with plans for separate break out session for caregivers (*this will be evolving over the next few months) Upcoming Power over Parkinson's Meetings/Care Partner Support:  2nd Wednesdays of the month at 2 pm:   November 8th,  December 13th  [place] at [email or web address] if interested in participating in this group    [place] and Fall Prevention Workshop.  Thursday, November 9th 1-2pm, [employer] [place], [employer].  Register with Vonna Kotyk at [email or web address] or [phone] New PWR! Moves Dynegy Instructor-Led Classes offering at [employer]!  TUESDAYS and Wednesdays 1-2 pm.   Contact Vonna Kotyk at  [email or web address]  or [phone] (Tuesday classes are modified for chair and standing only) Dance for Parkinson 's classes will be on Tuesdays 9:30am-10:30am starting October 3-December 12 with a break the week of November 21st. Located in the [employer] , in the first floor of the [employer] ([place].) To register:  [email or web address] or [phone]  Drumming for Parkinson's will be held on 2nd and 4th Mondays at 11:00 am.   Located at the [employer] ([place]  [place] at [email or web address] or [phone]  Through support from the [place] for Parkinson's classes are free for both patients and caregivers.    [employer] Parkinson's Tai Chi Class, Mondays at 11 am.  Call [phone] for details Parkinson's Holiday Party.  Wednesday, December 6th, 4:00-5:00 pm.  [facility] and Fitness.  RSVP to Garnetta Buddy at [phone] or [email or web address]   [place]:  www.[email or web address]  PD Health at Home continues:  Mindfulness Mondays, Wellness Wednesdays, Fitness Fridays   Upcoming Education:   Why Should you Participate in Parkinson's Research?  Wednesday, Nov. 29th,  1-2 pm  Expert  Briefing:    Hallucinations and Delusions in Parkinson's.  Wednesday, Nov. 8th, 1-2 pm  Register for expert briefings (webinars) at  [email or web address]  Please check out their website to sign up for emails and see their full online offerings      [place]:  [email or web address]   Third Thursday Webinars:  On the third Thursday of every month at 12 p.m. ET, join our free live webinars to learn about various aspects of living with Parkinson's disease and our work to speed medical breakthroughs.  Upcoming Webinar:  A Year Like No Other in Parkinson's Research:  2023 in Review.  Thursday, November 16th 12 noon. Check out additional information on their website to see their full online offerings    [facility]:  [email or web address]  Upcoming Webinar:   Stay tuned  Webinar Series:  Living with Parkinson's Meetup.   Third Thursdays each month, 3 pm  Care Partner Monthly Meetup.  With Robin Searing Phinney.  First Tuesday of each month, 2 pm  Check out additional information to Live Well Today on their website    Parkinson and Movement Disorders (PMD) Alliance:  www.[email or web address].org  [employer]:  Online Education Events  Sign up for emails, which are sent weekly to give you updates on programming and online offerings    Parkinson's Association of the [place]:  www.parkinsonassociation.org  Information on online support groups, education events, and online exercises including Yoga, Parkinson's exercises and more-LOTS of information on links to PD resources and online events  Virtual Support Group through Parkinson's Association of the [place]; next one is scheduled for Wednesday, November 1st  at 2 pm.  (These are typically scheduled for the 1st Wednesday of the month at 2 pm).  Visit website for details.   MOVEMENT AND EXERCISE OPPORTUNITIES  PWR! Moves Classes at [place].  Wednesdays 10 and 11 am.   Contact Amy Marriott, PT [email or web address] if interested.  NEW PWR! Moves Class  offerings at [employer].  *TUESDAYS* and Wednesdays 1-2 pm.  Contact Vonna Kotyk at  [email or web address]    Parkinson's Wellness Recovery (PWR! Moves)  www.[email or web address]  Info on the PWR! Virtual Experience:  You will have access to our expertise?through self-assessment, guided plans that start with the PD-specific fundamentals, educational content, tips, Q&A with an expert, and a growing Art therapist of PD-specific pre-recorded and live exercise classes of varying types and intensity - both physical and cognitive! If that is not enough, we offer 1:1 wellness consultations (in-person or virtual) to personalize your PWR! Research scientist (medical).   [place] Fridays:   As part of the PD Health @ Home program, this free video series focuses each week on one aspect of fitness designed to support people living with Parkinson's.? These weekly videos highlight the [place] fitness guidelines for people with Parkinson's disease.  [email or web address]   Dance for PD website is offering free, live-stream classes throughout the week, as well as links to [employer] of classes:  [email or web address]  Virtual dance and Pilates for Parkinson's classes: Click on the Community Tab> Parkinson's Movement Initiative Tab.  To register for classes and for more information, visit [email or web address] and click the "community" tab.   [employer] Parkinson's Cycling Classes   [employer]:  Thursdays @ Noon-Live classes at [employer] ([employer] at [email or web address]?or [phone])  [employer]: Virtual Classes Mondays and Thursdays Jeanette Caprice classes Tuesday, Wednesday and Thursday (contact [place] at [email or web address] ?or [phone])  [place] [employer]  Varied levels of classes  are offered Tuesdays and Thursdays at [employer].   Stretching with Verdis Frederickson weekly  class is also offered for people with Parkinson's  To observe a class or for more information, call [phone] or email Hezzie Bump at [email or web address]   ADDITIONAL SUPPORT AND RESOURCES  Well-Spring Solutions:Online Caregiver Education Opportunities:  www.well-springsolutions.org/caregiver-education/caregiver-support-group.  You may also contact Vickki Muff at [email or web address] or [phone].     Well-Spring Navigator:  Just1Navigator program, a?free service to help individuals and families through the journey of determining care for older adults.  The "Navigator" is a Education officer, museum, Arnell Asal, who will speak with a prospective client and/or loved ones to provide an assessment of the situation and a set of recommendations for a personalized care plan -- all free of charge, and whether?Well-Spring Solutions offers the needed service or not. If the need is not a service we provide, we are well-connected with reputable programs in town that we can refer you to.  www.well-springsolutions.org or to speak with the Navigator, call [phone].

## 2022-02-14 ENCOUNTER — Telehealth: Payer: Self-pay | Admitting: Neurology

## 2022-02-14 NOTE — Telephone Encounter (Signed)
Pt called an informed that The support group is the 2nd wed of the month at [facility] from 2-3 pm.  Mostly this is back in person now.  We have option for online but she think only one couple from [place] is doing this via WebEx.  If he is local,she would encourage him to come in person.  If unable, we can get him connected via WebEx. Pt wanted Dr Tat to know that he took his paperwork to the [facility] and he will be taken 2 classes.

## 2022-02-14 NOTE — Telephone Encounter (Signed)
Patient needs to speak with someone about Parkinson support group, he was given a folder with some information about it yesterday he needs to know which website to download   He left a message on the VM yesterday

## 2022-02-19 ENCOUNTER — Telehealth: Payer: Self-pay | Admitting: Neurology

## 2022-02-19 NOTE — Telephone Encounter (Signed)
Called pateint and let him know that he can go to any derm doc

## 2022-02-19 NOTE — Telephone Encounter (Signed)
Pt called in stating Dr. Arbutus Leas recommended him to see a dermatologist. She gave him some options, but he says he already has one that was not one of those options. He would like to know if Dr. Arbutus Leas wants him to specifically see one of the ones she recommended or can he see his normal dermatologist?

## 2022-03-06 ENCOUNTER — Ambulatory Visit: Payer: 59 | Admitting: Neurology

## 2022-03-19 ENCOUNTER — Ambulatory Visit: Payer: 59 | Admitting: Neurology

## 2022-05-11 ENCOUNTER — Other Ambulatory Visit: Payer: Self-pay | Admitting: Neurology

## 2022-06-08 DIAGNOSIS — S86911A Strain of unspecified muscle(s) and tendon(s) at lower leg level, right leg, initial encounter: Secondary | ICD-10-CM | POA: Diagnosis not present

## 2022-06-14 NOTE — Progress Notes (Signed)
Assessment/Plan:   1.  Parkinsons Disease, although atypical state cannot be ruled out  -Continue carbidopa/levodopa 25/100, 1 tablet 3 times per day.  -Limit alcohol to no more than 2 days/week.  -Discontinue marijuana all together.    -Have discussed skin biopsies for alpha-synuclein, but his insurance likely would not pay for this.  -We discussed that it used to be thought that levodopa would increase risk of melanoma but now it is believed that Parkinsons itself likely increases risk of melanoma. he is to get regular skin checks. He is doing that - dermatology in [place].     Subjective:   Hector Douglas was seen today in follow up for Parkinsons disease.  My previous records were reviewed prior to todays visit as well as outside records available to me.  Sister with him and supplements hx.   Levodopa was started last visit and the patient reports that he has no SE with the medication.  He thinks it helps it works but not sure.  He had one fall - he was ducking to miss a branch of a tree and hit the tree and fell and scraped his forehead and nose.  He was referred to therapies last visit.  He hasn't been exercising due to knee pain last few weeks.   Prior to that, he had been doing cycle class at [place].  Occ lightheadedness when first gets up from the bed.  Drinking alcohol 2 days per week.  He has "pretty much" cut out marijuana.    No hallucinations.  Mood has been good.  Current prescribed movement disorder medications: Carbidopa/levodopa 25/100, 1 tablet 3 times per day (started last visit)    ALLERGIES:  No Known Allergies  CURRENT MEDICATIONS:  Current Meds  Medication Sig   carbidopa-levodopa (SINEMET IR) 25-100 MG tablet Take 1 tablet by mouth 3 (three) times daily. 6am/11am/4pm   loratadine (CLARITIN) 10 MG tablet Take 10 mg by mouth daily.   montelukast (SINGULAIR) 10 MG tablet Take 10 mg by mouth at bedtime.   Multiple Vitamins-Minerals (CENTRUM SILVER 50+MEN) TABS Take  1 tablet by mouth daily.   simvastatin (ZOCOR) 80 MG tablet Take 80 mg by mouth daily.   tolterodine (DETROL LA) 4 MG 24 hr capsule Take 4 mg by mouth daily.     Objective:   PHYSICAL EXAMINATION:    VITALS:   Vitals:   06/18/22 0837  BP: (!) 130/90  Pulse: 69  SpO2: 96%  Weight: 192 lb 6.4 oz (87.3 kg)  Height: 5\' 6"  (1.676 m)    GEN:  The patient appears stated age and is in NAD. HEENT:  Normocephalic, atraumatic.  The mucous membranes are moist. The superficial temporal arteries are without ropiness or tenderness. CV:  RRR Lungs:  CTAB Neck/HEME:  There are no carotid bruits bilaterally.  Neurological examination:  Orientation: The patient is alert and oriented x3. Cranial nerves: There is good facial symmetry with facial hypomimia. The speech is fluent and clear. Soft palate rises symmetrically and there is no tongue deviation. Hearing is intact to conversational tone. Sensation: Sensation is intact to light touch throughout Motor: Strength is at least antigravity x4.  Movement examination: Tone: There is mild increased tone on the RUE.  Tone elsewhere is normal Abnormal movements:none today Coordination:  There is decremation only with finger taps on the R.  All other RAMs with any form of RAMS, including alternating supination and pronation of the forearm, hand opening and closing, heel taps and toe  taps bilaterally Gait and Station: The patient has no difficulty arising out of a deep-seated chair without the use of the hands. The patient's stride length is good with good arm swing bilaterally.    I have reviewed and interpreted the following labs independently    Chemistry      Component Value Date/Time   NA 137 10/25/2021 1054   K 3.9 10/25/2021 1054   CL 107 10/25/2021 1054   CO2 22 10/25/2021 1054   BUN 13 10/25/2021 1054   CREATININE 0.94 10/25/2021 1054      Component Value Date/Time   CALCIUM 9.1 10/25/2021 1054   ALKPHOS 66 07/04/2020 0138   AST 30  07/04/2020 0138   ALT 28 07/04/2020 0138   BILITOT 0.5 07/04/2020 0138       Lab Results  Component Value Date   WBC 6.9 10/25/2021   HGB 15.1 10/25/2021   HCT 45.4 10/25/2021   MCV 92.8 10/25/2021   PLT 269 10/25/2021    No results found for: "TSH"    Cc:  Donald Prose, MD

## 2022-06-18 ENCOUNTER — Encounter: Payer: Self-pay | Admitting: Neurology

## 2022-06-18 ENCOUNTER — Ambulatory Visit: Payer: BC Managed Care – PPO | Admitting: Neurology

## 2022-06-18 VITALS — BP 130/90 | HR 69 | Ht 66.0 in | Wt 192.4 lb

## 2022-06-18 DIAGNOSIS — G20A1 Parkinson's disease without dyskinesia, without mention of fluctuations: Secondary | ICD-10-CM

## 2022-06-18 NOTE — Patient Instructions (Signed)
Local and Online Resources for Power over Parkinson's Group  March 2024   LOCAL [place] PARKINSON'S GROUPS   Power over Parkinson's Group:    Power Over Parkinson's Patient Education Group will be Wednesday, March 13th-*Hybrid meting*- in person at [place] [facility] location and via WEBEX, 2:00-3:00 pm.   Starting in November 2023, Power over Parkinson's and Care Partner Groups will meet together, with plans for separate break out session for caregivers (*this will be evolving over the next few months) Upcoming Power over Parkinson's Meetings/Care Partner Support:  2nd Wednesdays of the month at 2 pm:  March 13th, April 10th Contact Amy Marriott at [email or web address] if interested in participating in this group    LOCAL EVENTS AND NEW OFFERINGS  Let's Try Pickleball-$25 for 6 weeks of Pickleball, starting February 2nd.  Contact Sarah Chambers for more details.  [email or web address] NEW:  Parkinson's Social Game Night.  First Thursday of each month, 2:00-4:00 pm.  *Next date is MARCH 7th*.  [facility], [place].  Contact [email or web address] if interested. Parkinson's CarePartner Group for Men is in the works, if interested email [email or web address] ACT [employer] Chair Yoga classes "Train and Gain", Fridays 10 am, [employer].  Contact Gina at [phone].  PWR! Moves Community Fitness Instructor-Led Classes offering at [employer]!  TUESDAYS and Wednesdays 1-2 pm.   Contact Christy Weaver at  [email or web address]  or [phone] (Tuesday classes are modified for chair and standing only) Drumming for Parkinson's will be held on 2nd and 4th Mondays at 11:00 am.   Located at the [employer] ([address] [place].)  Contact Jane Maydian at [email or web address] or [phone]  Dance for Parkinson 's classes will be on Tuesdays 10-11 am starting in February. Located in the [employer], in the first floor of the [facility] ([address] To register:  [email or web address] or [phone] [employer] Parkinson's Tai Chi Class, Mondays at 11 am.  Call [phone] for details Moving Day [place].  Saturday, May 4th, 10 am start.  Register at [email or web address]    ONLINE EDUCATION AND SUPPORT  Parkinson Foundation:  www.parkinson.org  PD Health at Home continues:  Mindfulness Mondays, Wellness Wednesdays, Fitness Fridays  (PWR! Moves as part of Fitness Fridays March 22nd, 1-1:45 pm) Upcoming Education:   Managing "Off" Periods:  Return of Parkinson's Symptoms.  Wednesday, March 20th, 1-2 pm Parkinson's 101.  Wednesday, April 3rd, 1-2 pm Expert Briefing:  Understanding Pain in Parkinson's.   Wednesday, March 13th, 1-2 pm  Research Update:  Working to Halt PD.  Wednesday, April 10th, 1-2 pm Register for virtual education and expert briefings (webinars) at www.[email or web address].org/resources-support/online-education Please check out their website to sign up for emails and see their full online offerings     [employer] Foundation:  www.michaeljfox.org   Third Thursday Webinars:  On the third Thursday of every month at 12 p.m. ET, join our free live webinars to learn about various aspects of living with Parkinson's disease and our work to speed medical breakthroughs.  Upcoming Webinar:  Everyday Exposure to Parkinson's:  Environmental Connections to the Disease.  Thursday, March 21st at 12 noon. Check out additional information on their website to see their full online offerings    Davis Phinney Foundation:  www.davisphinneyfoundation.org  Upcoming Webinar:   Nutrition and Parkinson's.  Wednesday, March 6th, 12 noon Webinar Series:  Living with Parkinson's Meetup.   Third Thursdays each month, 3 pm  Care Partner Monthly   Meetup.  With Connie Carpenter Phinney.  First Tuesday of each month, 2 pm  Check out additional information  to Live Well Today on their website    Parkinson and Movement Disorders (PMD) Alliance:  www.pmdalliance.org  NeuroLife Online:  Online Education Events  Sign up for emails, which are sent weekly to give you updates on programming and online offerings    Parkinson's Association of the [place]:  www.parkinsonassociation.org  Information on online support groups, education events, and online exercises including Yoga, Parkinson's exercises and more-LOTS of information on links to PD resources and online events  Virtual Support Group through Parkinson's Association of the [place]; next one is scheduled for Wednesday, March 6th  MOVEMENT AND EXERCISE OPPORTUNITIES  PWR! Moves Classes at [place] [facility].  Wednesdays 10 and 11 am.   Contact Amy Marriott, PT [email or web address] if interested.  PWR! Moves Class offerings at [employer]. *TUESDAYS* and Wednesdays 1-2 pm.    Contact Christy Weaver at  [email or web address]    Parkinson's Wellness Recovery (PWR! Moves)  www.pwr4life.org  Info on the PWR! Virtual Experience:  You will have access to our expertise?through self-assessment, guided plans that start with the PD-specific fundamentals, educational content, tips, Q&A with an expert, and a growing library of PD-specific pre-recorded and live exercise classes of varying types and intensity - both physical and cognitive! If that is not enough, we offer 1:1 wellness consultations (in-person or virtual) to personalize your PWR! Virtual Experience.   Parkinson Foundation Fitness Fridays:   As part of the PD Health @ Home program, this free video series focuses each week on one aspect of fitness designed to support people living with Parkinson's.? These weekly videos highlight the Parkinson Foundation fitness guidelines for people with Parkinson's disease.  www.parkinson.org/resources-support/online-education/pdhealth#ff  Dance for PD website is offering free, live-stream  classes throughout the week, as well as links to digital library of classes:  [email or web address]://danceforparkinsons.org/  Virtual dance and Pilates for Parkinson's classes: Click on the Community Tab> Parkinson's Movement Initiative Tab.  To register for classes and for more information, visit www.americandancefestival.org and click the "community" tab.   [employer] Parkinson's Cycling Classes   [employer]:  Thursdays @ Noon-Live classes at [employer] (Contact Margaret Hazen at [email or web address]?or [phone])  [employer]: Virtual Classes Mondays and Thursdays /Live classes Tuesday, Wednesday and Thursday (contact Marlee at [email or web address] ?or [phone])  [place] Steady Boxing  Varied levels of classes are offered Tuesdays and Thursdays at [employer].   Stretching with Maria weekly class is also offered for people with Parkinson's  To observe a class or for more information, call [phone] or email Hillary [place] at [email or web address]   ADDITIONAL SUPPORT AND RESOURCES  Well-Spring Solutions:Online Caregiver Education Opportunities:  www.well-springsolutions.org/caregiver-education/caregiver-support-group.  You may also contact Jodi Kolada at [email or web address] or [phone].     Family Caregiver Winter Retreat.  Thursday, March 7th, 10:15-1:45 at [place].  Register with Jodi Kolada (see above) Well-Spring Navigator:  Just1Navigator program, a?free service to help individuals and families through the journey of determining care for older adults.  The "Navigator" is a social worker, Nicole Reynolds, who will speak with a prospective client and/or loved ones to provide an assessment of the situation and a set of recommendations for a personalized care plan -- all free of charge, and whether?Well-Spring Solutions offers the needed service or not. If the need is not a service we provide, we are well-connected with reputable programs in  town that we can refer you to.  www.well-springsolutions.org or   to speak with the Navigator, call [phone].     

## 2022-06-19 DIAGNOSIS — G4733 Obstructive sleep apnea (adult) (pediatric): Secondary | ICD-10-CM | POA: Diagnosis not present

## 2022-06-22 ENCOUNTER — Other Ambulatory Visit: Payer: Self-pay | Admitting: Sports Medicine

## 2022-06-22 ENCOUNTER — Ambulatory Visit
Admission: RE | Admit: 2022-06-22 | Discharge: 2022-06-22 | Disposition: A | Discharge status: Home / Self Care | Payer: BC Managed Care – PPO | Source: Ambulatory Visit | Attending: Sports Medicine | Admitting: Sports Medicine

## 2022-06-22 DIAGNOSIS — M25561 Pain in right knee: Secondary | ICD-10-CM

## 2022-06-22 DIAGNOSIS — M1711 Unilateral primary osteoarthritis, right knee: Secondary | ICD-10-CM | POA: Diagnosis not present

## 2022-06-25 DIAGNOSIS — G20C Parkinsonism, unspecified: Secondary | ICD-10-CM | POA: Diagnosis not present

## 2022-06-25 DIAGNOSIS — G4733 Obstructive sleep apnea (adult) (pediatric): Secondary | ICD-10-CM | POA: Diagnosis not present

## 2022-07-06 DIAGNOSIS — M25561 Pain in right knee: Secondary | ICD-10-CM | POA: Diagnosis not present

## 2022-07-20 ENCOUNTER — Telehealth: Payer: Self-pay

## 2022-07-20 NOTE — Telephone Encounter (Signed)
Patient states that he has seen the side effects, wondering if he should stop taking it as he is afraid that some of the symptoms could heighten some of what he is experiencing currently such as anxiety,confusion,memory issues. Contacted PCP as well but wondered what Dr.Tats recommendations are.

## 2022-07-23 NOTE — Telephone Encounter (Signed)
Called patient and went over Dr. Iona Beard recommendations and patient understood no further questions at this time

## 2022-07-23 NOTE — Telephone Encounter (Signed)
Called patient back to get more information and he is asking about his Singular and that the side effects he read can mirror PD. Patient wanting to know if Dr. Arbutus Leas recommends he stop taking the singular he has not taken all weekend and will not take until he hears back from Dr. Arbutus Leas

## 2022-08-03 ENCOUNTER — Other Ambulatory Visit: Payer: Self-pay | Admitting: Neurology

## 2022-08-03 DIAGNOSIS — G20A1 Parkinson's disease without dyskinesia, without mention of fluctuations: Secondary | ICD-10-CM

## 2022-08-03 DIAGNOSIS — M25561 Pain in right knee: Secondary | ICD-10-CM | POA: Diagnosis not present

## 2022-08-06 NOTE — Telephone Encounter (Signed)
Pt states he got something in  the mychart about a refill and he is confused  please call   Carbidopa levodopa  and he uses the [facility]

## 2022-08-16 ENCOUNTER — Ambulatory Visit: Payer: BC Managed Care – PPO

## 2022-08-17 DIAGNOSIS — M25661 Stiffness of right knee, not elsewhere classified: Secondary | ICD-10-CM | POA: Diagnosis not present

## 2022-08-17 DIAGNOSIS — M25561 Pain in right knee: Secondary | ICD-10-CM | POA: Diagnosis not present

## 2022-08-17 DIAGNOSIS — M6281 Muscle weakness (generalized): Secondary | ICD-10-CM | POA: Diagnosis not present

## 2022-08-20 DIAGNOSIS — M6281 Muscle weakness (generalized): Secondary | ICD-10-CM | POA: Diagnosis not present

## 2022-08-20 DIAGNOSIS — M25561 Pain in right knee: Secondary | ICD-10-CM | POA: Diagnosis not present

## 2022-08-20 DIAGNOSIS — M25661 Stiffness of right knee, not elsewhere classified: Secondary | ICD-10-CM | POA: Diagnosis not present

## 2022-08-20 DIAGNOSIS — R3915 Urgency of urination: Secondary | ICD-10-CM | POA: Diagnosis not present

## 2022-08-20 DIAGNOSIS — N3281 Overactive bladder: Secondary | ICD-10-CM | POA: Diagnosis not present

## 2022-08-21 ENCOUNTER — Ambulatory Visit: Payer: BC Managed Care – PPO

## 2022-08-22 DIAGNOSIS — M6281 Muscle weakness (generalized): Secondary | ICD-10-CM | POA: Diagnosis not present

## 2022-08-22 DIAGNOSIS — M25561 Pain in right knee: Secondary | ICD-10-CM | POA: Diagnosis not present

## 2022-08-22 DIAGNOSIS — M25661 Stiffness of right knee, not elsewhere classified: Secondary | ICD-10-CM | POA: Diagnosis not present

## 2022-08-23 ENCOUNTER — Ambulatory Visit: Payer: BC Managed Care – PPO

## 2022-08-29 DIAGNOSIS — M6281 Muscle weakness (generalized): Secondary | ICD-10-CM | POA: Diagnosis not present

## 2022-08-29 DIAGNOSIS — M25561 Pain in right knee: Secondary | ICD-10-CM | POA: Diagnosis not present

## 2022-08-29 DIAGNOSIS — M25661 Stiffness of right knee, not elsewhere classified: Secondary | ICD-10-CM | POA: Diagnosis not present

## 2022-08-31 DIAGNOSIS — M25661 Stiffness of right knee, not elsewhere classified: Secondary | ICD-10-CM | POA: Diagnosis not present

## 2022-08-31 DIAGNOSIS — M6281 Muscle weakness (generalized): Secondary | ICD-10-CM | POA: Diagnosis not present

## 2022-08-31 DIAGNOSIS — M25561 Pain in right knee: Secondary | ICD-10-CM | POA: Diagnosis not present

## 2022-09-03 DIAGNOSIS — M25561 Pain in right knee: Secondary | ICD-10-CM | POA: Diagnosis not present

## 2022-09-06 ENCOUNTER — Other Ambulatory Visit: Payer: Self-pay | Admitting: Sports Medicine

## 2022-09-06 DIAGNOSIS — M25561 Pain in right knee: Secondary | ICD-10-CM

## 2022-09-28 ENCOUNTER — Ambulatory Visit
Admission: RE | Admit: 2022-09-28 | Discharge: 2022-09-28 | Disposition: A | Discharge status: Home / Self Care | Payer: BC Managed Care – PPO | Source: Ambulatory Visit | Attending: Sports Medicine | Admitting: Sports Medicine

## 2022-09-28 DIAGNOSIS — M23221 Derangement of posterior horn of medial meniscus due to old tear or injury, right knee: Secondary | ICD-10-CM | POA: Diagnosis not present

## 2022-09-28 DIAGNOSIS — M25561 Pain in right knee: Secondary | ICD-10-CM

## 2022-10-09 DIAGNOSIS — G4733 Obstructive sleep apnea (adult) (pediatric): Secondary | ICD-10-CM | POA: Diagnosis not present

## 2022-10-11 DIAGNOSIS — H53483 Generalized contraction of visual field, bilateral: Secondary | ICD-10-CM | POA: Diagnosis not present

## 2022-10-11 DIAGNOSIS — H40033 Anatomical narrow angle, bilateral: Secondary | ICD-10-CM | POA: Diagnosis not present

## 2022-10-11 DIAGNOSIS — H53023 Refractive amblyopia, bilateral: Secondary | ICD-10-CM | POA: Diagnosis not present

## 2022-10-30 DIAGNOSIS — M1711 Unilateral primary osteoarthritis, right knee: Secondary | ICD-10-CM | POA: Diagnosis not present

## 2022-11-12 ENCOUNTER — Telehealth: Payer: Self-pay | Admitting: Neurology

## 2022-11-12 NOTE — Telephone Encounter (Signed)
Patient is calling about refills on Carbidopa-Levodopa 25-100mg 

## 2022-11-13 ENCOUNTER — Other Ambulatory Visit: Payer: Self-pay | Admitting: Neurology

## 2022-11-13 DIAGNOSIS — G20A1 Parkinson's disease without dyskinesia, without mention of fluctuations: Secondary | ICD-10-CM

## 2022-11-13 DIAGNOSIS — G3184 Mild cognitive impairment, so stated: Secondary | ICD-10-CM | POA: Diagnosis not present

## 2022-11-13 DIAGNOSIS — E785 Hyperlipidemia, unspecified: Secondary | ICD-10-CM | POA: Diagnosis not present

## 2022-11-13 DIAGNOSIS — Z0000 Encounter for general adult medical examination without abnormal findings: Secondary | ICD-10-CM | POA: Diagnosis not present

## 2022-11-13 DIAGNOSIS — F411 Generalized anxiety disorder: Secondary | ICD-10-CM | POA: Diagnosis not present

## 2022-11-13 NOTE — Telephone Encounter (Signed)
Pt called no answer left a voice mail that a refill was sent in

## 2022-11-13 NOTE — Telephone Encounter (Signed)
Reill sent in for pt

## 2022-11-29 DIAGNOSIS — M1711 Unilateral primary osteoarthritis, right knee: Secondary | ICD-10-CM | POA: Diagnosis not present

## 2022-12-06 DIAGNOSIS — M1711 Unilateral primary osteoarthritis, right knee: Secondary | ICD-10-CM | POA: Diagnosis not present

## 2022-12-12 NOTE — Progress Notes (Signed)
Assessment/Plan:   1.  Parkinsons Disease, although atypical state cannot be ruled out  -Continue carbidopa/levodopa 25/100, 1 tablet 3 times per day.  -Limit alcohol to no more than 2 days/week.  This is what he is doing.    -He has d/c marijuana all together.    -Have discussed skin biopsies for alpha-synuclein, but his insurance likely would not pay for this.  -We discussed that it used to be thought that levodopa would increase risk of melanoma but now it is believed that Parkinsons itself likely increases risk of melanoma. he is to get regular skin checks. He is doing that - dermatology in [place].    2.  Knee pain right  -discussed Parkinsons Disease and surgical considerations  Subjective:   Hector Douglas was seen today in follow up for Parkinsons disease.  My previous records were reviewed prior to todays visit as well as outside records available to me.  Sister with him and supplements hx.   patient continues to take levodopa.  Denies falls.  He is doing stretching exercises and walking 3-5 miles daily.  He is also doing Parkinsons Disease cycle class 2 days/week.  Denies lightheadedness or near syncope.  No cramping of the feet/legs at night but sometimes does have them of the L leg during day when sitting.  Following with derm in [place] - last was 01/2022.  He is having knee pain and will have shots soon.  They ask about surgical considerations.    Current prescribed movement disorder medications: Carbidopa/levodopa 25/100, 1 tablet 3 times per day     ALLERGIES:   Allergies  Allergen Reactions   Seasonal Ic [Octacosanol]     CURRENT MEDICATIONS:  Current Meds  Medication Sig   carbidopa-levodopa (SINEMET IR) 25-100 MG tablet TAKE ONE TABLET BY MOUTH THREE TIMES A DAY AT 6AM, 11AM, AND 4PM   loratadine (CLARITIN) 10 MG tablet Take 10 mg by mouth daily.   Multiple Vitamins-Minerals (CENTRUM SILVER 50+MEN) TABS Take 1 tablet by mouth daily.   sildenafil (VIAGRA) 100 MG tablet  Take 100 mg by mouth daily as needed for erectile dysfunction.   simvastatin (ZOCOR) 80 MG tablet Take 80 mg by mouth daily.   tolterodine (DETROL LA) 4 MG 24 hr capsule Take 4 mg by mouth daily.     Objective:   PHYSICAL EXAMINATION:    VITALS:   Vitals:   12/18/22 0804  BP: 124/88  Pulse: 74  SpO2: 97%  Weight: 195 lb (88.5 kg)  Height: 5\' 6"  (1.676 m)     GEN:  The patient appears stated age and is in NAD. HEENT:  Normocephalic, atraumatic.  The mucous membranes are moist. The superficial temporal arteries are without ropiness or tenderness. CV:  RRR Lungs:  CTAB Neck/HEME:  There are no carotid bruits bilaterally.  Neurological examination:  Orientation: The patient is alert and oriented x3. Cranial nerves: There is good facial symmetry with facial hypomimia. The speech is fluent and clear. Soft palate rises symmetrically and there is no tongue deviation. Hearing is intact to conversational tone. Sensation: Sensation is intact to light touch throughout Motor: Strength is at least antigravity x4.  Movement examination: Tone: There is mild increased tone on the RUE.  Tone elsewhere is normal Abnormal movements:none today Coordination:  There is decremation only with finger taps and hand opening/closing on the L Gait and Station: The patient has no difficulty arising out of a deep-seated chair without the use of the hands. The patient's  stride length is good with good arm swing bilaterally.    I have reviewed and interpreted the following labs independently    Chemistry      Component Value Date/Time   NA 137 10/25/2021 1054   K 3.9 10/25/2021 1054   CL 107 10/25/2021 1054   CO2 22 10/25/2021 1054   BUN 13 10/25/2021 1054   CREATININE 0.94 10/25/2021 1054      Component Value Date/Time   CALCIUM 9.1 10/25/2021 1054   ALKPHOS 66 07/04/2020 0138   AST 30 07/04/2020 0138   ALT 28 07/04/2020 0138   BILITOT 0.5 07/04/2020 0138       Lab Results  Component  Value Date   WBC 6.9 10/25/2021   HGB 15.1 10/25/2021   HCT 45.4 10/25/2021   MCV 92.8 10/25/2021   PLT 269 10/25/2021    No results found for: "TSH"    Cc:  Deatra James, MD

## 2022-12-13 DIAGNOSIS — M1711 Unilateral primary osteoarthritis, right knee: Secondary | ICD-10-CM | POA: Diagnosis not present

## 2022-12-18 ENCOUNTER — Ambulatory Visit: Payer: BC Managed Care – PPO | Admitting: Neurology

## 2022-12-18 ENCOUNTER — Encounter: Payer: Self-pay | Admitting: Neurology

## 2022-12-18 VITALS — BP 124/88 | HR 74 | Ht 66.0 in | Wt 195.0 lb

## 2022-12-18 DIAGNOSIS — M25561 Pain in right knee: Secondary | ICD-10-CM

## 2022-12-18 DIAGNOSIS — G20A1 Parkinson's disease without dyskinesia, without mention of fluctuations: Secondary | ICD-10-CM | POA: Diagnosis not present

## 2022-12-18 NOTE — Patient Instructions (Addendum)
SAVE THE DATE!  We are planning a Parkinsons Disease educational symposium at [facility] in [place] on October 11.  More details to come!  If you would like to be added to our email list to get further information, email [email or web address].  To sign up, you can email [email or web address].  We hope to see you there!   Regarding possible upcoming surgery, I recommend:  -take your parkinsons medication the AM of surgery  -if medication for nausea is needed after surgery, I recommend zofran instead of phenergan as phenergan can make parkinsons worse  -asking your surgeon if it is possible if the surgery can be done under a local block or spinal anesthesia.  If not then general anesthesia is fine

## 2023-01-02 ENCOUNTER — Telehealth: Payer: Self-pay | Admitting: Neurology

## 2023-01-02 NOTE — Telephone Encounter (Signed)
Patient asking about the dermatologist appointment. Gave patient answers and he understood

## 2023-01-02 NOTE — Telephone Encounter (Signed)
Caller stated she would like to speak with nurse about upcoming appointment he was referred to

## 2023-01-07 ENCOUNTER — Telehealth: Payer: Self-pay | Admitting: Neurology

## 2023-01-07 NOTE — Telephone Encounter (Signed)
Pt called in stating the other day he woke up around 3 AM and had some swelling and pain in his lower left hand. The part that is connected in the thumb. He was also drooling.

## 2023-01-07 NOTE — Telephone Encounter (Signed)
Patient would like to keep this in his chart it is not bothering him at this point

## 2023-01-15 DIAGNOSIS — M1711 Unilateral primary osteoarthritis, right knee: Secondary | ICD-10-CM | POA: Diagnosis not present

## 2023-01-15 DIAGNOSIS — G4733 Obstructive sleep apnea (adult) (pediatric): Secondary | ICD-10-CM | POA: Diagnosis not present

## 2023-01-16 ENCOUNTER — Telehealth: Payer: Self-pay | Admitting: Neurology

## 2023-01-16 NOTE — Telephone Encounter (Signed)
Called pateint and gave dr. Arbutus Leas recommendation he understood and is taking his meds

## 2023-01-16 NOTE — Telephone Encounter (Signed)
Pt called in stating he missed his 7:00AM carbidopa-levodopa pill. He just ate 30 minutes ago. He will be off the rest of the day. He is not sure what he should do? He says to leave a voicemail he might be mowing the lawn.

## 2023-02-13 ENCOUNTER — Other Ambulatory Visit: Payer: Self-pay | Admitting: Neurology

## 2023-02-13 DIAGNOSIS — G20A1 Parkinson's disease without dyskinesia, without mention of fluctuations: Secondary | ICD-10-CM

## 2023-02-14 DIAGNOSIS — L814 Other melanin hyperpigmentation: Secondary | ICD-10-CM | POA: Diagnosis not present

## 2023-02-14 DIAGNOSIS — D225 Melanocytic nevi of trunk: Secondary | ICD-10-CM | POA: Diagnosis not present

## 2023-02-14 DIAGNOSIS — L570 Actinic keratosis: Secondary | ICD-10-CM | POA: Diagnosis not present

## 2023-02-14 DIAGNOSIS — L218 Other seborrheic dermatitis: Secondary | ICD-10-CM | POA: Diagnosis not present

## 2023-04-08 DIAGNOSIS — G4733 Obstructive sleep apnea (adult) (pediatric): Secondary | ICD-10-CM | POA: Diagnosis not present

## 2023-05-10 ENCOUNTER — Other Ambulatory Visit: Payer: Self-pay | Admitting: Neurology

## 2023-05-10 DIAGNOSIS — G20A1 Parkinson's disease without dyskinesia, without mention of fluctuations: Secondary | ICD-10-CM

## 2023-06-14 NOTE — Progress Notes (Signed)
 Assessment/Plan:   1.  Parkinsons Disease, although atypical state cannot be ruled out  -Continue carbidopa/levodopa 25/100, 1 tablet 3 times per day.  -add carbidopa/levodopa 50/200 CR at bed for internal tremor at night  -Limit alcohol to no more than 2 days/week.  This is what he is doing.    -He has d/c marijuana all together.    -Have discussed skin biopsies for alpha-synuclein, but his insurance likely would not pay for this.  -We discussed that it used to be thought that levodopa would increase risk of melanoma but now it is believed that Parkinsons itself likely increases risk of melanoma. he is to get regular skin checks. He is doing that - follows with [facility] in [place]  2.  Knee pain right  -discussed Parkinsons Disease and surgical considerations  3.  EDS  -he and I discussed changing to CR levodopa but he doesn't find that EDS is big enough deal to change versions of levodopa  -on cpap faithfully - sees [facility]  4.  GAD  -discussed counseling.  He wants to hold but is interested in future if we have it in house  Subjective:   Hector Douglas was seen today in follow up for Parkinsons disease.  My previous records were reviewed prior to todays visit as well as outside records available to me.  Sister with him and supplements hx.   patient has been doing fairly well since our last visit.  No falls.  No lightheadedness or near syncope.  Takes levodopa as directed.  Has been following faithfully with his dermatologist.  He brought notes today.  Doing stretching, walking and going to cycling a few days per week [facility]).   No diplopia.  No near syncope.  He gets sleepy and is unsure if related to medication or not.  Wakes up feeling nervous/tremulousness at 4am.  Having anxiety  Current prescribed movement disorder medications: Carbidopa/levodopa 25/100, 1 tablet 3 times per day   ALLERGIES:   Allergies  Allergen Reactions   Seasonal Ic  [Octacosanol]     CURRENT MEDICATIONS:  Current Meds  Medication Sig   carbidopa-levodopa (SINEMET IR) 25-100 MG tablet TAKE ONE TABLET BY MOUTH THREE TIMES A DAY AT 6AM, 11AM, AND 4PM   loratadine (CLARITIN) 10 MG tablet Take 10 mg by mouth daily.   Multiple Vitamins-Minerals (CENTRUM SILVER 50+MEN) TABS Take 1 tablet by mouth daily.   sildenafil (VIAGRA) 100 MG tablet Take 100 mg by mouth daily as needed for erectile dysfunction.   simvastatin (ZOCOR) 80 MG tablet Take 80 mg by mouth daily.   tolterodine (DETROL LA) 4 MG 24 hr capsule Take 4 mg by mouth daily.     Objective:   PHYSICAL EXAMINATION:    VITALS:   Vitals:   06/18/23 0811  BP: 122/73  Pulse: 72  SpO2: 98%  Weight: 193 lb 3.2 oz (87.6 kg)  Height: 5\' 6"  (1.676 m)      GEN:  The patient appears stated age and is in NAD. HEENT:  Normocephalic, atraumatic.  The mucous membranes are moist. The superficial temporal arteries are without ropiness or tenderness. CV:  RRR Lungs:  CTAB Neck/HEME:  There are no carotid bruits bilaterally.  Neurological examination:  Orientation: The patient is alert and oriented x3. Cranial nerves: There is good facial symmetry with facial hypomimia. The speech is fluent and clear. Soft palate rises symmetrically and there is no tongue deviation. Hearing is intact to conversational tone.  Sensation: Sensation is intact to light touch throughout Motor: Strength is at least antigravity x4.  Movement examination: Tone: There is mild increased tone on the RUE.  Tone elsewhere is normal Abnormal movements:none today Coordination:  There is decremation with any form of RAMS, including alternating supination and pronation of the forearm, hand opening and closing, finger taps, heel taps and toe taps on the L Gait and Station: The patient has no difficulty arising out of a deep-seated chair without the use of the hands. The patient's stride length is good with good arm swing bilaterally.     I have reviewed and interpreted the following labs independently    Chemistry      Component Value Date/Time   NA 137 10/25/2021 1054   K 3.9 10/25/2021 1054   CL 107 10/25/2021 1054   CO2 22 10/25/2021 1054   BUN 13 10/25/2021 1054   CREATININE 0.94 10/25/2021 1054      Component Value Date/Time   CALCIUM 9.1 10/25/2021 1054   ALKPHOS 66 07/04/2020 0138   AST 30 07/04/2020 0138   ALT 28 07/04/2020 0138   BILITOT 0.5 07/04/2020 0138       Lab Results  Component Value Date   WBC 6.9 10/25/2021   HGB 15.1 10/25/2021   HCT 45.4 10/25/2021   MCV 92.8 10/25/2021   PLT 269 10/25/2021    No results found for: "TSH"  Total time spent on today's visit was 40 minutes, including both face-to-face time and nonface-to-face time.  Time included that spent on review of records (prior notes available to me/labs/imaging if pertinent), discussing treatment and goals, answering patient's questions and coordinating care.   Cc:  Deatra James, MD

## 2023-06-18 ENCOUNTER — Ambulatory Visit: Payer: BC Managed Care – PPO | Admitting: Neurology

## 2023-06-18 ENCOUNTER — Encounter: Payer: Self-pay | Admitting: Neurology

## 2023-06-18 VITALS — BP 122/73 | HR 72 | Ht 66.0 in | Wt 193.2 lb

## 2023-06-18 DIAGNOSIS — F411 Generalized anxiety disorder: Secondary | ICD-10-CM | POA: Diagnosis not present

## 2023-06-18 DIAGNOSIS — G20A1 Parkinson's disease without dyskinesia, without mention of fluctuations: Secondary | ICD-10-CM | POA: Diagnosis not present

## 2023-06-18 MED ORDER — CARBIDOPA-LEVODOPA ER 50-200 MG PO TBCR: 1.0000 | EXTENDED_RELEASE_TABLET | Freq: Every day | ORAL | 1 refills | Status: DC

## 2023-06-18 NOTE — Patient Instructions (Signed)
 ADD carbidopa/levodopa 50/200 at bedtime  The physicians and staff at [facility] are committed to providing excellent care. You may receive a survey requesting feedback about your experience at our office. We strive to receive "very good" responses to the survey questions. If you feel that your experience would prevent you from giving the office a "very good " response, please contact our office to try to remedy the situation. We may be reached at [phone]. Thank you for taking the time out of your busy day to complete the survey.

## 2023-06-25 DIAGNOSIS — G4733 Obstructive sleep apnea (adult) (pediatric): Secondary | ICD-10-CM | POA: Diagnosis not present

## 2023-06-25 DIAGNOSIS — G20C Parkinsonism, unspecified: Secondary | ICD-10-CM | POA: Diagnosis not present

## 2023-07-05 ENCOUNTER — Telehealth: Payer: Self-pay | Admitting: Neurology

## 2023-07-05 NOTE — Telephone Encounter (Signed)
 Patient called to see if Tat has received paperwork about disability. They said they sent some on 06/11/23 and some today 07/05/23. He is just following up to see if he needs to get it sent again

## 2023-07-05 NOTE — Telephone Encounter (Signed)
 Called patient and he is having them resent it

## 2023-07-08 ENCOUNTER — Telehealth: Payer: Self-pay | Admitting: Neurology

## 2023-07-08 NOTE — Telephone Encounter (Signed)
 Hector Douglas called to let Hector Douglas know they have not sent the Disability papers yet. Once we do get them, he would like Hector Douglas to let him know.

## 2023-08-05 ENCOUNTER — Other Ambulatory Visit: Payer: Self-pay | Admitting: Neurology

## 2023-08-05 DIAGNOSIS — G20A1 Parkinson's disease without dyskinesia, without mention of fluctuations: Secondary | ICD-10-CM

## 2023-08-08 DIAGNOSIS — M1711 Unilateral primary osteoarthritis, right knee: Secondary | ICD-10-CM | POA: Diagnosis not present

## 2023-08-21 DIAGNOSIS — N3281 Overactive bladder: Secondary | ICD-10-CM | POA: Diagnosis not present

## 2023-08-21 DIAGNOSIS — R3914 Feeling of incomplete bladder emptying: Secondary | ICD-10-CM | POA: Diagnosis not present

## 2023-08-21 DIAGNOSIS — N401 Enlarged prostate with lower urinary tract symptoms: Secondary | ICD-10-CM | POA: Diagnosis not present

## 2023-08-21 DIAGNOSIS — R350 Frequency of micturition: Secondary | ICD-10-CM | POA: Diagnosis not present

## 2023-08-27 ENCOUNTER — Telehealth: Payer: Self-pay | Admitting: Neurology

## 2023-08-27 NOTE — Telephone Encounter (Signed)
 Pt. Cld took more of their rx dosage (carbidopa -levodopa ) double the norm amount and wants to know what to do at next dosage, denies any symptoms

## 2023-08-27 NOTE — Telephone Encounter (Signed)
 Spoke to patient and he had already spoke to after hours and Dr. Winferd Hatter he is back on track with his meds

## 2023-09-12 ENCOUNTER — Telehealth: Payer: Self-pay

## 2023-09-12 NOTE — Telephone Encounter (Signed)
 Left message with the after hour service on 09-12-23   Caller states that when taking his carbidopa   levodopa  he accidentally took his ER dosage that he takes at night with his morning dosage   The after hours called the office before they sent this message over and spoke with [facility]

## 2023-09-12 NOTE — Telephone Encounter (Signed)
 Received a call from the access nurse that this patient has accidentally taken his medication incorrectly again. He took his 50/200 CR this morning and then took his 25/100 at the normal time. Looking back at patients notes this has been an issue before.

## 2023-10-08 DIAGNOSIS — G4733 Obstructive sleep apnea (adult) (pediatric): Secondary | ICD-10-CM | POA: Diagnosis not present

## 2023-11-09 ENCOUNTER — Other Ambulatory Visit: Payer: Self-pay | Admitting: Neurology

## 2023-11-12 ENCOUNTER — Other Ambulatory Visit: Payer: Self-pay | Admitting: Neurology

## 2023-11-12 DIAGNOSIS — G20A1 Parkinson's disease without dyskinesia, without mention of fluctuations: Secondary | ICD-10-CM

## 2023-11-13 ENCOUNTER — Telehealth: Payer: Self-pay | Admitting: Neurology

## 2023-11-13 NOTE — Telephone Encounter (Signed)
 Pt needs refill of carbidopa -levodopa  (SINEMET  IR) 25-100 MG tablet , contacted pharmacy [facility] [address] - [place], [place]  and they said he need to contact us 

## 2023-11-13 NOTE — Telephone Encounter (Signed)
 RX sent and patient called

## 2023-11-25 NOTE — Progress Notes (Unsigned)
 Assessment/Plan:   1.  Parkinsons Disease, although atypical state cannot be ruled out  -Continue carbidopa /levodopa  25/100, 1 tablet 3 times per day.  -continue carbidopa /levodopa  50/200 CR at bed for internal tremor at night  -Limit alcohol to no more than 2 days/week.  This is what he is doing.    -rarely using marijuana and I would like to see it all d/c  -Have discussed skin biopsies for alpha-synuclein, but his insurance likely would not pay for this.  -We discussed that it used to be thought that levodopa  would increase risk of melanoma but now it is believed that Parkinsons itself likely increases risk of melanoma. he is to get regular skin checks. He is doing that - follows with [facility] in [place]  2.  Knee pain right  -discussed Parkinsons Disease and surgical considerations.  He is contemplating cbd.  I don't really have objection but told him may help pain but likely not helpful for Parkinsons Disease   3.  EDS  -on cpap faithfully - sees [facility]  4.  GAD  -discussed counseling with him again and he assures me he is going to think about that and talk about with his sister.  Subjective:   Hector Douglas was seen today in follow up for Parkinsons disease.  My previous records were reviewed prior to todays visit as well as outside records available to me.  Sister with him and supplements hx.   CR levodopa  was added last visit at bedtime for internal tremor at night.  He reports today that this helped.  He is exercising - biking with [employer]/ stretching/ walking.  He tried to do RSB but he didn't enjoy it.  He is using cpap faithfully.  Mood has been good - he has been anxious but declines counseling.  Sister says that depression is definitely better and so is anxiety but he still has anxiety.    Current prescribed movement disorder medications: Carbidopa /levodopa  25/100, 1 tablet 3 times per day  Carbidopa /levodopa  50/200 CR at bedtime (added last  visit)  ALLERGIES:   Allergies  Allergen Reactions   Seasonal Ic [Octacosanol]     CURRENT MEDICATIONS:  Current Meds  Medication Sig   carbidopa -levodopa  (SINEMET  CR) 50-200 MG tablet TAKE ONE TABLET BY MOUTH AT BEDTIME   carbidopa -levodopa  (SINEMET  IR) 25-100 MG tablet TAKE ONE TABLET BY MOUTH THREE TIMES A DAY AT 6AM, 11AM, AND 4PM   loratadine  (CLARITIN ) 10 MG tablet Take 10 mg by mouth daily.   Multiple Vitamins-Minerals (CENTRUM SILVER 50+MEN) TABS Take 1 tablet by mouth daily.   sildenafil (VIAGRA) 100 MG tablet Take 100 mg by mouth daily as needed for erectile dysfunction.   simvastatin (ZOCOR) 80 MG tablet Take 80 mg by mouth daily.   tolterodine (DETROL LA) 4 MG 24 hr capsule Take 4 mg by mouth daily.     Objective:   PHYSICAL EXAMINATION:    VITALS:   Vitals:   11/27/23 0807  BP: (!) 140/82  Pulse: 68  SpO2: 99%  Weight: 179 lb 6.4 oz (81.4 kg)    GEN:  The patient appears stated age and is in NAD. HEENT:  Normocephalic, atraumatic.  The mucous membranes are moist. The superficial temporal arteries are without ropiness or tenderness. CV:  RRR Lungs:  CTAB Neck/HEME:  There are no carotid bruits bilaterally.  Neurological examination:  Orientation: The patient is alert and oriented x3. Cranial nerves: There is good facial symmetry with facial hypomimia.  The speech is fluent and clear. Soft palate rises symmetrically and there is no tongue deviation. Hearing is intact to conversational tone. Sensation: Sensation is intact to light touch throughout Motor: Strength is at least antigravity x4.  Movement examination: Tone: There is min increased tone in the RUE Abnormal movements:none today Coordination:  There is mild decremation with finger taps on the R and hand opening and closing on the R Gait and Station: The patient has no difficulty arising out of a deep-seated chair without the use of the hands. The patient's stride length is good with good arm swing  bilaterally.    I have reviewed and interpreted the following labs independently    Chemistry      Component Value Date/Time   NA 137 10/25/2021 1054   K 3.9 10/25/2021 1054   CL 107 10/25/2021 1054   CO2 22 10/25/2021 1054   BUN 13 10/25/2021 1054   CREATININE 0.94 10/25/2021 1054      Component Value Date/Time   CALCIUM  9.1 10/25/2021 1054   ALKPHOS 66 07/04/2020 0138   AST 30 07/04/2020 0138   ALT 28 07/04/2020 0138   BILITOT 0.5 07/04/2020 0138       Lab Results  Component Value Date   WBC 6.9 10/25/2021   HGB 15.1 10/25/2021   HCT 45.4 10/25/2021   MCV 92.8 10/25/2021   PLT 269 10/25/2021    No results found for: TSH  Total time spent on today's visit was 30 minutes, including both face-to-face time and nonface-to-face time.  Time included that spent on review of records (prior notes available to me/labs/imaging if pertinent), discussing treatment and goals, answering patient's questions and coordinating care.   Cc:  Sun, Vyvyan, MD

## 2023-11-27 ENCOUNTER — Ambulatory Visit: Admitting: Neurology

## 2023-11-27 ENCOUNTER — Encounter: Payer: Self-pay | Admitting: Neurology

## 2023-11-27 VITALS — BP 140/82 | HR 68 | Wt 179.4 lb

## 2023-11-27 DIAGNOSIS — F411 Generalized anxiety disorder: Secondary | ICD-10-CM | POA: Diagnosis not present

## 2023-11-27 DIAGNOSIS — G20A1 Parkinson's disease without dyskinesia, without mention of fluctuations: Secondary | ICD-10-CM | POA: Diagnosis not present

## 2023-11-27 NOTE — Patient Instructions (Addendum)

## 2023-12-03 ENCOUNTER — Telehealth: Payer: Self-pay | Admitting: Neurology

## 2023-12-03 NOTE — Telephone Encounter (Signed)
 Pt filled out intake info for Dr. Austin and wants Dr. Evonnie can have the info, just checking if that has been done

## 2023-12-04 NOTE — Telephone Encounter (Signed)
 Talked to patient and he is sending us  needed paperwork to put in our system

## 2023-12-06 DIAGNOSIS — E785 Hyperlipidemia, unspecified: Secondary | ICD-10-CM | POA: Diagnosis not present

## 2023-12-06 DIAGNOSIS — Z0000 Encounter for general adult medical examination without abnormal findings: Secondary | ICD-10-CM | POA: Diagnosis not present

## 2023-12-25 ENCOUNTER — Telehealth: Payer: Self-pay | Admitting: Neurology

## 2023-12-25 ENCOUNTER — Encounter: Payer: Self-pay | Admitting: Neurology

## 2023-12-25 NOTE — Telephone Encounter (Signed)
 Patient states that he wanted to let us  know that the other day he woke up and could not remember the day before. He looked over his text messages and started to recall the day. He states that he looked over his exercise log and remember that he used the lidocaine  patches and he found out that it can interfere with the Carbidopa  medication.   He said that we could call his sister Rollo at [phone] if we would like to

## 2023-12-27 NOTE — Telephone Encounter (Signed)
 Called patients sister and left detailed message

## 2023-12-31 NOTE — Telephone Encounter (Signed)
 Rollo patients sister called back and let me know more about the episode the patient had. He wakes up occasionally very anxious. This day he woke up very anxious thinking about the lidocaine  patches and also stating he couldn't remember anything. Patients sister sat down with him calmed him down and they began to go over text messages as well as what had happened the day before. Patient started remembering and stated he felt much better. Patients sister just wanted us  to know and there has been npo other episodes. I did tell Patients sister to call back if any other loss of memory episodes occur or if he starts to be confused and hallucinate. Patient is back at Baseline with no more anxiety attacks at this time

## 2024-01-06 DIAGNOSIS — G4733 Obstructive sleep apnea (adult) (pediatric): Secondary | ICD-10-CM | POA: Diagnosis not present

## 2024-02-07 ENCOUNTER — Other Ambulatory Visit: Payer: Self-pay | Admitting: Neurology

## 2024-02-07 DIAGNOSIS — G20A1 Parkinson's disease without dyskinesia, without mention of fluctuations: Secondary | ICD-10-CM

## 2024-02-10 ENCOUNTER — Telehealth: Payer: Self-pay | Admitting: Neurology

## 2024-02-10 NOTE — Telephone Encounter (Signed)
 Pt is having hallucination of footsteps, door, shadow and voices after taking Rx but not before last dose

## 2024-02-12 NOTE — Telephone Encounter (Signed)
 Pt said he doesn't think he needs an appt. The voices/noise he heard was the trash can

## 2024-02-18 DIAGNOSIS — L578 Other skin changes due to chronic exposure to nonionizing radiation: Secondary | ICD-10-CM | POA: Diagnosis not present

## 2024-02-18 DIAGNOSIS — D2271 Melanocytic nevi of right lower limb, including hip: Secondary | ICD-10-CM | POA: Diagnosis not present

## 2024-02-18 DIAGNOSIS — W908XXS Exposure to other nonionizing radiation, sequela: Secondary | ICD-10-CM | POA: Diagnosis not present

## 2024-02-18 DIAGNOSIS — L218 Other seborrheic dermatitis: Secondary | ICD-10-CM | POA: Diagnosis not present

## 2024-02-18 DIAGNOSIS — L814 Other melanin hyperpigmentation: Secondary | ICD-10-CM | POA: Diagnosis not present

## 2024-02-18 DIAGNOSIS — L570 Actinic keratosis: Secondary | ICD-10-CM | POA: Diagnosis not present

## 2024-02-18 DIAGNOSIS — D225 Melanocytic nevi of trunk: Secondary | ICD-10-CM | POA: Diagnosis not present

## 2024-02-18 DIAGNOSIS — L821 Other seborrheic keratosis: Secondary | ICD-10-CM | POA: Diagnosis not present

## 2024-03-03 ENCOUNTER — Telehealth: Payer: Self-pay | Admitting: Neurology

## 2024-03-03 NOTE — Telephone Encounter (Signed)
 Pt cld would like a referral for counseling

## 2024-03-04 ENCOUNTER — Other Ambulatory Visit: Payer: Self-pay

## 2024-03-04 DIAGNOSIS — F32A Depression, unspecified: Secondary | ICD-10-CM

## 2024-03-04 DIAGNOSIS — F411 Generalized anxiety disorder: Secondary | ICD-10-CM

## 2024-03-04 NOTE — Telephone Encounter (Signed)
 Dan called back and lvm  to follow up on a call regarding referral for therapy.   PH: [phone].

## 2024-03-25 ENCOUNTER — Ambulatory Visit: Admitting: Behavioral Health

## 2024-03-25 DIAGNOSIS — F4323 Adjustment disorder with mixed anxiety and depressed mood: Secondary | ICD-10-CM | POA: Diagnosis not present

## 2024-03-25 NOTE — Progress Notes (Signed)
 Photographer Health Counselor Initial Adult Exam  Name: Hector Douglas Date: 03/25/2024 MRN: [id number] DOB: [birth year] PCP: Sun, Vyvyan, MD  Time spent: 60 min Caregility video; Pt is on his phone in private & Provider working remotely from [employer]. Pt is aware of the risks/limitations of telehealth & consents to Tx today. Time In: 10:00am Time Out: 11:00am  Guardian/Payee:  [employer]    Paperwork requested: No   Reason for Visit /Presenting Problem: Elevated anx/dep & adjustment to Dx of Parkinson's  Mental Status Exam: Appearance:   Casual     Behavior:  Appropriate and Sharing  Motor:  Normal  Speech/Language:   Clear and Coherent  Affect:  Appropriate  Mood:  normal  Thought process:  normal  Thought content:    WNL  Sensory/Perceptual disturbances:    WNL  Orientation:  oriented to person, place, time/date, and situation  Attention:  Good  Concentration:  Good  Memory:  WNL  Fund of knowledge:   Good  Insight:    Good  Judgment:   Good  Impulse Control:  Good   Risk Assessment: Danger to Self:  No Self-injurious Behavior: No Danger to Others: No Duty to Warn:no Physical Aggression / Violence:No  Access to Firearms a concern: No  Gang Involvement:No  Patient / guardian was educated about steps to take if suicide or homicide risk level increases between visits: yes; appropriate to ICD process While future psychiatric events cannot be accurately predicted, the patient does not currently require acute inpatient psychiatric care and does not currently meet [place]  involuntary commitment criteria.  Substance Abuse History: Current substance abuse: No     Past Psychiatric History:   Previous psychological history is significant for anxiety Outpatient Providers: Dr. Asberry Schneider, MD History of Psych Hospitalization: No  Psychological Testing: NA   Abuse History:  Victim of: No., NA   Report needed: No. Victim of Neglect:No. Perpetrator of  NA  Witness / Exposure to Domestic Violence: No   Protective Services Involvement: No  Witness to [employer] Violence:  No   Family History:  Family History  Problem Relation Age of Onset   Dementia Mother    Cancer Mother 75       breast   Hyperlipidemia Mother    Cancer Father        bladder cancer   Heart attack Father    Prostate cancer Father    Healthy Sister    Diabetes Maternal Grandmother    Breast cancer Maternal Grandmother    Heart attack Maternal Grandfather    Hypertension Paternal Grandmother    Heart failure Paternal Grandmother    Alzheimer's disease Paternal Grandmother    Dementia Paternal Grandfather    Hypertension Paternal Grandfather    Prostate cancer Paternal Uncle     Living situation: the patient lives alone  Sexual Orientation: Straight  Relationship Status: single  Name of spouse / other: NA If a parent, number of children / ages: NA  Support Systems: friends Siblings  Financial Stress:  Yes   Income/Employment/Disability: Energy Manager Service: No   Educational History: Education: Risk Manager: unk  Any cultural differences that may affect / interfere with treatment:  None reported today  Recreation/Hobbies: Limited right now  Stressors: Health problems   Loss of cohesion in the Family w/4 other Sibs & problems w/Father's Last Will Testament    Strengths: Supportive Relationships, Family, Friends, Hopefulness, Journalist, Newspaper, and Able to  Communicate Effectively  Barriers:  None noted today   Legal History: Pending legal issue / charges: The patient has no significant history of legal issues. History of legal issue / charges: NA   Medical History/Surgical History: reviewed Past Medical History:  Diagnosis Date   DDD (degenerative disc disease), cervical    Diverticulosis of colon    ED (erectile dysfunction)    GAD (generalized anxiety disorder)     History of GI diverticular bleed    07/ 2022 syncope due to anemia   History of panic attacks    Hyperlipidemia    Mass of left thigh    MCI (mild cognitive impairment)    OAB (overactive bladder)    urologist--- dr Hector Douglas   OSA on CPAP    per pt uses nightly   Primary parkinsonism [facility])    neurologist-- dr Hector Douglas   Psoriasis     Past Surgical History:  Procedure Laterality Date   APPENDECTOMY  1977   CATARACT EXTRACTION W/ INTRAOCULAR LENS IMPLANT Bilateral 2021   COLONOSCOPY WITH PROPOFOL  N/A 07/01/2020   Procedure: COLONOSCOPY WITH PROPOFOL ;  Surgeon: Hector Dover, MD;  Location[facility] ENDOSCOPY;  Service: Endoscopy;  Laterality: N/A;   ESOPHAGOGASTRODUODENOSCOPY (EGD) WITH PROPOFOL  N/A 07/01/2020   Procedure: ESOPHAGOGASTRODUODENOSCOPY (EGD) WITH PROPOFOL ;  Surgeon: Hector Dover, MD;  Location: [facility] ENDOSCOPY;  Service: Endoscopy;  Laterality: N/A;   MASS EXCISION Left 01/04/2022   Procedure: EXCISION OF LEFT THIGH MASS;  Surgeon: Hector Leonor CROME, MD;  Location: [facility];  Service: General;  Laterality: Left;   POLYPECTOMY  07/01/2020   Procedure: POLYPECTOMY;  Surgeon: Hector Dover, MD;  Location[facility] ENDOSCOPY;  Service: Endoscopy;;   UMBILICAL HERNIA REPAIR  2018    Medications: Current Outpatient Medications  Medication Sig Dispense Refill   carbidopa -levodopa  (SINEMET  CR) 50-200 MG tablet TAKE ONE TABLET BY MOUTH AT BEDTIME 90 tablet 1   carbidopa -levodopa  (SINEMET  IR) 25-100 MG tablet TAKE ONE TABLET BY MOUTH THREE TIMES A DAY AT 6AM, 11AM, AND 4PM 270 tablet 0   loratadine  (CLARITIN ) 10 MG tablet Take 10 mg by mouth daily.     Multiple Vitamins-Minerals (CENTRUM SILVER 50+MEN) TABS Take 1 tablet by mouth daily.     sildenafil (VIAGRA) 100 MG tablet Take 100 mg by mouth daily as needed for erectile dysfunction.     simvastatin (ZOCOR) 80 MG tablet Take 80 mg by mouth daily.     tolterodine (DETROL LA) 4 MG 24 hr capsule Take 4 mg by mouth daily.     No  current facility-administered medications for this visit.    Allergies[1]  Diagnoses:  Adjustment disorder with mixed anxiety and depressed mood  Plan of Care: Hector Douglas will explore the Support Grp for Pt's w/Parkinson's Dis - he has tried to do this for a full year. This Grp has been a positive influence on him. We will proceed to reduce his anxiety & deal w/pertinent, pressing Family dynamic issues as described to day.   Target Date: 04/16/2024  Progress: 5  Frequency: Once every 2-3 wks  Modality: Kennis Richerd Douglas Hollace, LMFT        [1]  Allergies Allergen Reactions   Seasonal Ic [Octacosanol]    "

## 2024-04-15 ENCOUNTER — Ambulatory Visit: Admitting: Behavioral Health

## 2024-04-15 DIAGNOSIS — F4323 Adjustment disorder with mixed anxiety and depressed mood: Secondary | ICD-10-CM | POA: Diagnosis not present

## 2024-04-15 NOTE — Progress Notes (Signed)
"    [facility] Counselor/Therapist Progress Note  Patient ID: Hector Douglas, MRN: [id number],    Date: 04/15/2024  Time Spent: 50 min In Person @ [facility] Office Time In: 9:00am Time Out: 9:50am   Treatment Type: Individual Therapy  Reported Symptoms: Elevated anx/dep & Family stressors w/Siblings due to Father's Last Will & Testament; concerns for health status changes w/Dx of Parkinson's  Mental Status Exam: Appearance:  Casual and Neat     Behavior: Appropriate, Sharing, and Motivated  Motor: Normal  Speech/Language:  Clear and Coherent  Affect: Appropriate and Congruent  Mood: anxious  Thought process: normal  Thought content:   WNL  Sensory/Perceptual disturbances:   WNL  Orientation: oriented to person, place, time/date, and situation  Attention: Good  Concentration: Good  Memory: WNL  Fund of knowledge:  Good  Insight:   Good  Judgment:  Good  Impulse Control: Good   Risk Assessment: Danger to Self:  No Self-injurious Behavior: No Danger to Others: No Duty to Warn:no Physical Aggression / Violence:No  Access to Firearms a concern: No  Gang Involvement:No   Subjective: Pt reports today an update since the Holidays. Family gathered & it was fairly normal. They are Irish & tend to deny any issues are happening w/in the Family System. There is much anticipatory talk amongst he & Siblings about the Parental Estate & how his middle Str Hector Douglas is handling this.   Pt is tending to his health well. He wakens anxious daily, but once he gets his routine started it subsides. He stretches every morning & walks 3-5 miles/day. [facility] Pt is making efforts to record in a Notebook daily bc he feels his recent memory for the day prior is weak. This recording helps him. A dinner w/his Str Hector Douglas 10 days ago was encouraging as she warned him not to, act like Mom in denial all the time.  Interventions:  Psycho-education/Bibliotherapy and Family Systems  Diagnosis:Adjustment disorder with mixed anxiety and depressed mood  Plan: Hector Douglas will cont to use his recordings to assist his recent memory. He will cont his exercise as he knows to move more, not less. He tries to keep active in general w/his friends. He is limiting ETOH intake & has quit using THC per Dr. Asberry Tat, MD's request. Pt will consider his conversation w/his Str after dinner further so he can try to define more accurately the issues she pointed out to him. We agreed on therapeutic support every 3 wks.  Target Date: 05/03/2024  Progress: 6  Frequency: Once every 2-3 wks  Modality: [name], LMFT    "

## 2024-04-17 ENCOUNTER — Telehealth: Payer: Self-pay | Admitting: Neurology

## 2024-04-17 NOTE — Telephone Encounter (Signed)
 Patient sister Rollo Sprague called and said she had a question for the clinical staff of Dr Tat. She said its regarding the patients anxiety. Please advise. Call back is [phone]. She is on the [facility] as well.

## 2024-04-17 NOTE — Telephone Encounter (Signed)
 Per patient sister, Patient complains of memory loss. He is concerned that he is forgetting more.   But she has spoken to his friends and she thinks as well as they do that his anxiety is getting worse. And they haven't seen any memory loss.  But they she know that she is not a professional and can not say yes or no that he is not having the memory loss.   Wants to know if he can be seen sooner then March. To discuss  Patient did go to the Psychology referral for two appts.  Yesterday was his last appt.

## 2024-04-22 ENCOUNTER — Telehealth: Payer: Self-pay | Admitting: Neurology

## 2024-04-22 NOTE — Telephone Encounter (Signed)
 Discuss his anxiety . Thanks

## 2024-04-22 NOTE — Telephone Encounter (Signed)
 Pt called in this afternoon and he stated that he wants his sister Rollo to receive a call to discuss his aniety

## 2024-04-23 NOTE — Telephone Encounter (Signed)
 Called and left detailed message on patients sister Donneta voicemail describing that patient needs to reach out to PCP for help with anxiety

## 2024-05-04 ENCOUNTER — Ambulatory Visit: Admitting: Behavioral Health

## 2024-05-04 DIAGNOSIS — F4323 Adjustment disorder with mixed anxiety and depressed mood: Secondary | ICD-10-CM

## 2024-05-04 NOTE — Progress Notes (Signed)
"    [facility] Counselor/Therapist Progress Note  Patient ID: Hector Douglas, MRN: [id number],    Date: 05/04/2024  Time Spent: 50 min TC due to technical difficulties for Pt. He is in his friend's home watching his dogs while OOT. Provider is working remotely from [employer]. He is aware of the risks/limitations of telehealth & consents to Tx today.  Time In: 10:00am Time Out: 10:50am  Treatment Type: Individual Therapy  Reported Symptoms: Elevated anx/dep, especially w/the weather  Mental Status Exam: Appearance:  Casual and Neat     Behavior: Appropriate and Sharing  Motor: Normal  Speech/Language:  Clear and Coherent  Affect: Appropriate  Mood: anxious  Thought process: normal  Thought content:   WNL  Sensory/Perceptual disturbances:   WNL  Orientation: oriented to person, place, time/date, and situation  Attention: Good  Concentration: Good  Memory: Impaired memory that worries him; his ST/day prior memory  Fund of knowledge:  Good  Insight:   Good  Judgment:  Good  Impulse Control: Good   Risk Assessment: Danger to Self:  No Self-injurious Behavior: No Danger to Others: No Duty to Warn:no Physical Aggression / Violence:No  Access to Firearms a concern: No  Gang Involvement:No   Subjective: Pt is taking care 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 [place] has been no clearance of the snow.    Pt is still concerned for his Parent's Will & how effectively his Str Hector Douglas will handle this once his Mother dies. She has Aphasia & he visits twice wkly for 1 1/2 hr/visit. He sits w/her & they take in the view out the window.   Interventions: Psycho-education/Bibliotherapy and Insight-Oriented  Diagnosis:Adjustment disorder with mixed anxiety and depressed mood  Plan: Hector Douglas has been handling his anxiety & tracking his levels daily. He worries for the Hector Douglas. His Siblings have been very  confusing about communication w/Mother. He will try playing some music for his Mother on his next visit w/her.   Target Date: 05/31/2024  Progress: 7  Frequency: Once every 2-3 wks  Modality: Kennis  Next: 05/25/2024 @ 10:00am   Richerd LITTIE Ling, LMFT    "

## 2024-05-06 ENCOUNTER — Other Ambulatory Visit: Payer: Self-pay | Admitting: Neurology

## 2024-05-06 DIAGNOSIS — G20A1 Parkinson's disease without dyskinesia, without mention of fluctuations: Secondary | ICD-10-CM

## 2024-05-25 ENCOUNTER — Ambulatory Visit: Admitting: Behavioral Health

## 2024-06-17 ENCOUNTER — Ambulatory Visit: Admitting: Neurology

## 2024-06-26 ENCOUNTER — Ambulatory Visit: Admitting: Neurology
